# Patient Record
Sex: Male | Born: 1965 | Race: White | Hispanic: No | Marital: Single
Health system: Southern US, Community
[De-identification: ages and names within clinical notes are randomized; demographics above are authoritative.]

---

## 2021-07-01 ENCOUNTER — Inpatient Hospital Stay
Admission: EM | Admit: 2021-07-01 | Discharge: 2021-08-05 | Disposition: A | Discharge status: Other Acute Inpatient Hospital | Payer: Medicare Other | Source: Other Acute Inpatient Hospital | Attending: Internal Medicine | Admitting: Internal Medicine

## 2021-07-01 ENCOUNTER — Other Ambulatory Visit (HOSPITAL_COMMUNITY): Payer: Medicare Other

## 2021-07-01 DIAGNOSIS — Z431 Encounter for attention to gastrostomy: Secondary | ICD-10-CM

## 2021-07-01 DIAGNOSIS — F419 Anxiety disorder, unspecified: Secondary | ICD-10-CM

## 2021-07-01 DIAGNOSIS — Z452 Encounter for adjustment and management of vascular access device: Secondary | ICD-10-CM

## 2021-07-01 DIAGNOSIS — R509 Fever, unspecified: Secondary | ICD-10-CM

## 2021-07-01 DIAGNOSIS — G373 Acute transverse myelitis in demyelinating disease of central nervous system: Secondary | ICD-10-CM

## 2021-07-01 DIAGNOSIS — J9690 Respiratory failure, unspecified, unspecified whether with hypoxia or hypercapnia: Secondary | ICD-10-CM

## 2021-07-01 DIAGNOSIS — D72829 Elevated white blood cell count, unspecified: Secondary | ICD-10-CM

## 2021-07-01 DIAGNOSIS — J9621 Acute and chronic respiratory failure with hypoxia: Secondary | ICD-10-CM

## 2021-07-01 DIAGNOSIS — Z9189 Other specified personal risk factors, not elsewhere classified: Secondary | ICD-10-CM

## 2021-07-01 DIAGNOSIS — J939 Pneumothorax, unspecified: Secondary | ICD-10-CM

## 2021-07-01 DIAGNOSIS — J189 Pneumonia, unspecified organism: Secondary | ICD-10-CM

## 2021-07-01 DIAGNOSIS — I82A19 Acute embolism and thrombosis of unspecified axillary vein: Secondary | ICD-10-CM

## 2021-07-01 LAB — BLOOD GAS, ARTERIAL
Acid-Base Excess: 2.9 mmol/L — ABNORMAL HIGH (ref 0.0–2.0)
Acid-Base Excess: 3.6 mmol/L — ABNORMAL HIGH (ref 0.0–2.0)
Bicarbonate: 27.1 mmol/L (ref 20.0–28.0)
Bicarbonate: 28 mmol/L (ref 20.0–28.0)
Drawn by: 164
FIO2: 100
FIO2: 35
O2 Saturation: 99.6 %
O2 Saturation: 99.7 %
Patient temperature: 36.4
Patient temperature: 37
pCO2 arterial: 43.3 mmHg (ref 32.0–48.0)
pCO2 arterial: 43.8 mmHg (ref 32.0–48.0)
pH, Arterial: 7.413 (ref 7.350–7.450)
pH, Arterial: 7.418 (ref 7.350–7.450)
pO2, Arterial: 180 mmHg — ABNORMAL HIGH (ref 83.0–108.0)
pO2, Arterial: 275 mmHg — ABNORMAL HIGH (ref 83.0–108.0)

## 2021-07-02 LAB — CBC
HCT: 37.6 % — ABNORMAL LOW (ref 39.0–52.0)
Hemoglobin: 11.7 g/dL — ABNORMAL LOW (ref 13.0–17.0)
MCH: 27.3 pg (ref 26.0–34.0)
MCHC: 31.1 g/dL (ref 30.0–36.0)
MCV: 87.6 fL (ref 80.0–100.0)
Platelets: 182 10*3/uL (ref 150–400)
RBC: 4.29 MIL/uL (ref 4.22–5.81)
RDW: 16 % — ABNORMAL HIGH (ref 11.5–15.5)
WBC: 16.4 10*3/uL — ABNORMAL HIGH (ref 4.0–10.5)
nRBC: 0 % (ref 0.0–0.2)

## 2021-07-02 LAB — COMPREHENSIVE METABOLIC PANEL
ALT: 18 U/L (ref 0–44)
AST: 21 U/L (ref 15–41)
Albumin: 3.4 g/dL — ABNORMAL LOW (ref 3.5–5.0)
Alkaline Phosphatase: 135 U/L — ABNORMAL HIGH (ref 38–126)
Anion gap: 11 (ref 5–15)
BUN: 23 mg/dL — ABNORMAL HIGH (ref 6–20)
CO2: 25 mmol/L (ref 22–32)
Calcium: 9.7 mg/dL (ref 8.9–10.3)
Chloride: 100 mmol/L (ref 98–111)
Creatinine, Ser: 0.43 mg/dL — ABNORMAL LOW (ref 0.61–1.24)
GFR, Estimated: >60 mL/min (ref >60)
Glucose, Bld: 151 mg/dL — ABNORMAL HIGH (ref 70–99)
Potassium: 4.9 mmol/L (ref 3.5–5.1)
Sodium: 136 mmol/L (ref 135–145)
Total Bilirubin: 0.2 mg/dL — ABNORMAL LOW (ref 0.3–1.2)
Total Protein: 6.5 g/dL (ref 6.5–8.1)

## 2021-07-02 LAB — PROTIME-INR
INR: 1.5 — ABNORMAL HIGH (ref 0.8–1.2)
Prothrombin Time: 17.9 seconds — ABNORMAL HIGH (ref 11.4–15.2)

## 2021-07-03 DIAGNOSIS — F419 Anxiety disorder, unspecified: Secondary | ICD-10-CM

## 2021-07-03 DIAGNOSIS — J9621 Acute and chronic respiratory failure with hypoxia: Secondary | ICD-10-CM | POA: Diagnosis not present

## 2021-07-03 DIAGNOSIS — G373 Acute transverse myelitis in demyelinating disease of central nervous system: Secondary | ICD-10-CM | POA: Diagnosis not present

## 2021-07-03 DIAGNOSIS — I82A19 Acute embolism and thrombosis of unspecified axillary vein: Secondary | ICD-10-CM

## 2021-07-03 LAB — PROTIME-INR
INR: 1.6 — ABNORMAL HIGH (ref 0.8–1.2)
Prothrombin Time: 19.1 seconds — ABNORMAL HIGH (ref 11.4–15.2)

## 2021-07-03 LAB — TSH: TSH: 1.026 u[IU]/mL (ref 0.350–4.500)

## 2021-07-03 NOTE — Consult Note (Signed)
Pulmonary Critical Care Medicine [facility] [place]  PULMONARY SERVICE  Date of Service: 07/03/2021  PULMONARY CRITICAL CARE CONSULT   Dominic Benjamin  MWU:[id number]  DOB: 20-Feb-1966   DOA: 07/01/2021  Referring Physician: Carron Curie, MD  HPI: Dominic Benjamin is a 55 y.o. male seen for follow up of Acute on Chronic Respiratory Failure.  Patient has multiple medical problems including sepsis bradycardia diabetes mellitus presents to the hospital with a fall and had been confused.  The patient had apparently taken a fall few days prior to admission.  The patient had been seen at that time and was evaluated and was unremarkable however a few days later he continued to get more confused and was hypotensive not responding patient had profound bradycardia noted also.  MRI was done which showed a old lacunar infarct but nothing acute C-spine was done which showed signal alteration in the spinal cord from C2-T1 concerning for myelitis and edema.  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  Work-up included paraneoplastic which was unremarkable spinal tap was done which was unremarkable.  Transferred to our facility now for further management  Review of Systems:  ROS performed and is unremarkable other than noted above.  Past Medical History:  Diagnosis Date   AKI (acute kidney injury) (*) 05/25/2021   Anemia   Back pain   Diabetes mellitus (*)   GERD (gastroesophageal reflux disease)   Hyperlipidemia   Hypertension   Sepsis (*) 05/25/2021   Stroke (*) 05/2011   Past Surgical History:  Procedure Laterality Date   Anesth lower leg procedure Right 06/01/2016  right ankle fusion   Appendectomy   Back surgery   Colonoscopy N/A 12/22/2015  Procedure: COLONOSCOPY WITH ANESTHESIA ; Surgeon: Tonia Ghent, MD; Location: [facility] ENDO; Service: Gastroenterology; Laterality: N/A;   Heel spur surgery   Right hydrocelectomy   No  Known Allergies  Social History   Socioeconomic History   Marital status: Single  Spouse name: Not on file   Number of children: Not on file   Years of education: Not on file   Highest education level: Not on file  Occupational History   Not on file  Tobacco Use   Smoking status: Never Smoker   Smokeless tobacco: Current User  Types: Snuff  Substance and Sexual Activity   Alcohol use: No   Medications: Reviewed on Rounds  Physical Exam:  Vitals: Temperature is 97.6 pulse 66 respiratory rate is 27 blood pressure is 110/60 saturations 98%  Ventilator Settings on assist control FiO2 is 40% tidal volume 455 PEEP 5  General: Comfortable at this time Eyes: Grossly normal lids, irises & conjunctiva ENT: grossly tongue is normal Neck: no obvious mass Cardiovascular: S1-S2 normal no gallop or rub Respiratory: Scattered rhonchi expansion is equal Abdomen: Soft and nontender Skin: no rash seen on limited exam Musculoskeletal: not rigid Psychiatric:unable to assess Neurologic: no seizure no involuntary movements         Labs on Admission:  Basic Metabolic Panel: Recent Labs  Lab 07/02/21 0548  NA 136  K 4.9  CL 100  CO2 25  GLUCOSE 151*  BUN 23*  CREATININE 0.43*  CALCIUM 9.7    Recent Labs  Lab 07/01/21 1530 07/01/21 2330  PHART 7.413 7.418  PCO2ART 43.3 43.8  PO2ART 180* 275*  HCO3 27.1 28.0  O2SAT 99.6 99.7    Liver Function Tests: Recent Labs  Lab 07/02/21 0548  AST 21  ALT 18  ALKPHOS 135*  BILITOT 0.2*  PROT 6.5  ALBUMIN 3.4*   No results for input(s): LIPASE, AMYLASE in the last 168 hours. No results for input(s): AMMONIA in the last 168 hours.  CBC: Recent Labs  Lab 07/02/21 0548  WBC 16.4*  HGB 11.7*  HCT 37.6*  MCV 87.6  PLT 182    Cardiac Enzymes: No results for input(s): CKTOTAL, CKMB, CKMBINDEX, TROPONINI in the last 168 hours.  BNP (last 3 results) No results for input(s): BNP in the last 8760 hours.  ProBNP (last 3  results) No results for input(s): PROBNP in the last 8760 hours.   Radiological Exams on Admission: DG CHEST PORT 1 VIEW  Result Date: 07/01/2021 CLINICAL DATA:  At risk for change in condition.  On ventilator. EXAM: PORTABLE CHEST 1 VIEW COMPARISON:  None. FINDINGS: Tracheostomy tube tip projects over the thoracic inlet. The heart is normal in size. Normal mediastinal contours allowing for rotation. Mild streaky opacity in the left mid lung, favoring atelectasis. Minimal patchy opacity in the right mid lung zone. No pleural effusion or pneumothorax. No pulmonary edema. There is degenerative change in the spine. IMPRESSION: 1. Tracheostomy tube tip projects over the thoracic inlet. 2. Mild streaky opacity in the left mid lung, favoring atelectasis. Minimal patchy opacity in the right mid lung zone, atelectasis versus infection. Electronically Signed   By: Narda Rutherford M.D.   On: 07/01/2021 23:52   DG Abd Portable 1V  Result Date: 07/01/2021 CLINICAL DATA:  Assessed peg placement. EXAM: PORTABLE ABDOMEN - 1 VIEW COMPARISON:  None. FINDINGS: Image taken after Gastrografin was injected. Peg tube is within the stomach. Contrast pools within the antrum, entering the small intestine. No sign of extravasation. IMPRESSION: Peg tube in the stomach. Good appearance following contrast injection. No complicating feature. Electronically Signed   By: Paulina Fusi M.D.   On: 07/01/2021 17:10    Assessment/Plan Active Problems:   Acute on chronic respiratory failure with hypoxia (HCC)   Transverse myelitis (HCC)   Severe anxiety   DVT of axillary vein, acute (HCC)   Acute on chronic respiratory failure hypoxia at this time patient is on the ventilator and full support assist control mode respiratory therapy will check the RSB I and the mechanics to see if patient is feasible for weaning. Transverse myelitis resultant quadriplegia supportive care therapy as tolerated patient has already received course of  steroids Severe anxiety disorder patient had apparently been on oxycodone well defer to the primary care team for pain management Deep venous thrombosis last ultrasound was in August 3 showed some improvement patient was changed over to Lovenox and Coumadin which should be monitored.  I have personally seen and evaluated the patient, evaluated laboratory and imaging results, formulated the assessment and plan and placed orders. The Patient requires high complexity decision making with multiple systems involvement.  Case was discussed on Rounds with the Respiratory Therapy Director and the Respiratory staff Time Spent  Yevonne Pax, MD [facility] Pulmonary Critical Care Medicine Sleep Medicine

## 2021-07-04 DIAGNOSIS — G373 Acute transverse myelitis in demyelinating disease of central nervous system: Secondary | ICD-10-CM | POA: Diagnosis not present

## 2021-07-04 DIAGNOSIS — F419 Anxiety disorder, unspecified: Secondary | ICD-10-CM | POA: Diagnosis not present

## 2021-07-04 DIAGNOSIS — J9621 Acute and chronic respiratory failure with hypoxia: Secondary | ICD-10-CM | POA: Diagnosis not present

## 2021-07-04 DIAGNOSIS — I82A19 Acute embolism and thrombosis of unspecified axillary vein: Secondary | ICD-10-CM | POA: Diagnosis not present

## 2021-07-04 LAB — PROTIME-INR
INR: 2.8 — ABNORMAL HIGH (ref 0.8–1.2)
Prothrombin Time: 29.2 seconds — ABNORMAL HIGH (ref 11.4–15.2)

## 2021-07-04 NOTE — Progress Notes (Signed)
Pulmonary Critical Care Medicine [facility] [place]   PULMONARY CRITICAL CARE SERVICE  PROGRESS NOTE     Dominic Benjamin  [id number]:[id number]  DOB: 1966-05-16   DOA: 07/01/2021  Referring Physician: Carron Curie, MD  HPI: Dominic Benjamin is a 55 y.o. male being followed for ventilator/airway/oxygen weaning Acute on Chronic Respiratory Failure.  Patient is on the ventilator on full support was attempted on pressure support but did not tolerate it yesterday  Medications: Reviewed on Rounds  Physical Exam:  Vitals: Temperature is 96.4 pulse 60 respiratory 24 blood pressure is 119/59 saturations 97%  Ventilator Settings on assist control FiO2 is 40% tidal volume 450 PEEP 5  General: Comfortable at this time Neck: supple Cardiovascular: no malignant arrhythmias Respiratory: Scattered rhonchi very coarse breath sounds Skin: no rash seen on limited exam Musculoskeletal: No gross abnormality Psychiatric:unable to assess Neurologic:no involuntary movements         Lab Data:   Basic Metabolic Panel: Recent Labs  Lab 07/02/21 0548  NA 136  K 4.9  CL 100  CO2 25  GLUCOSE 151*  BUN 23*  CREATININE 0.43*  CALCIUM 9.7    ABG: Recent Labs  Lab 07/01/21 1530 07/01/21 2330  PHART 7.413 7.418  PCO2ART 43.3 43.8  PO2ART 180* 275*  HCO3 27.1 28.0  O2SAT 99.6 99.7    Liver Function Tests: Recent Labs  Lab 07/02/21 0548  AST 21  ALT 18  ALKPHOS 135*  BILITOT 0.2*  PROT 6.5  ALBUMIN 3.4*   No results for input(s): LIPASE, AMYLASE in the last 168 hours. No results for input(s): AMMONIA in the last 168 hours.  CBC: Recent Labs  Lab 07/02/21 0548  WBC 16.4*  HGB 11.7*  HCT 37.6*  MCV 87.6  PLT 182    Cardiac Enzymes: No results for input(s): CKTOTAL, CKMB, CKMBINDEX, TROPONINI in the last 168 hours.  BNP (last 3 results) No results for input(s): BNP in the last 8760 hours.  ProBNP (last 3 results) No results for input(s): PROBNP in the  last 8760 hours.  Radiological Exams: No results found.  Assessment/Plan Active Problems:   Acute on chronic respiratory failure with hypoxia (HCC)   Transverse myelitis (HCC)   Severe anxiety   DVT of axillary vein, acute (HCC)   Acute on chronic respiratory failure hypoxia patient is noted failed attempt at Pressure support wean plan is going to be to continue to have respiratory therapy assess the RSB I mechanics Transverse myelitis supportive care continue to monitor continue therapy as tolerated Severe anxiety no change supportive care DVT treated we will continue to follow along   I have personally seen and evaluated the patient, evaluated laboratory and imaging results, formulated the assessment and plan and placed orders. The Patient requires high complexity decision making with multiple systems involvement.  Rounds were done with the Respiratory Therapy Director and Staff therapists and discussed with nursing staff also.  Yevonne Pax, MD [facility] Pulmonary Critical Care Medicine Sleep Medicine

## 2021-07-05 DIAGNOSIS — G373 Acute transverse myelitis in demyelinating disease of central nervous system: Secondary | ICD-10-CM | POA: Diagnosis not present

## 2021-07-05 DIAGNOSIS — J9621 Acute and chronic respiratory failure with hypoxia: Secondary | ICD-10-CM | POA: Diagnosis not present

## 2021-07-05 DIAGNOSIS — I82A19 Acute embolism and thrombosis of unspecified axillary vein: Secondary | ICD-10-CM | POA: Diagnosis not present

## 2021-07-05 DIAGNOSIS — F419 Anxiety disorder, unspecified: Secondary | ICD-10-CM | POA: Diagnosis not present

## 2021-07-05 LAB — PROTIME-INR
INR: 3.4 — ABNORMAL HIGH (ref 0.8–1.2)
Prothrombin Time: 34.7 seconds — ABNORMAL HIGH (ref 11.4–15.2)

## 2021-07-05 NOTE — Progress Notes (Signed)
Pulmonary Critical Care Medicine [facility] [place]   PULMONARY CRITICAL CARE SERVICE  PROGRESS NOTE     Dominic Benjamin  [id number]:[id number]  DOB: 1965/12/11   DOA: 07/01/2021  Referring Physician: Carron Curie, MD  HPI: Dominic Benjamin is a 55 y.o. male being followed for ventilator/airway/oxygen weaning Acute on Chronic Respiratory Failure.  Patient is resting comfortably without distress remains on the ventilator and full support has been assist-control mode was attempted on pressure support again but did not tolerate  Medications: Reviewed on Rounds  Physical Exam:  Vitals: Temperature is 97.1 pulse 65 respiratory rate is 18 blood pressure is 132/80 saturations 97%  Ventilator Settings on assist control FiO2 is 50% tidal volume 430 PEEP of 5  General: Comfortable at this time Neck: supple Cardiovascular: no malignant arrhythmias Respiratory: No rhonchi no rales are noted at this time Skin: no rash seen on limited exam Musculoskeletal: No gross abnormality Psychiatric:unable to assess Neurologic:no involuntary movements         Lab Data:   Basic Metabolic Panel: Recent Labs  Lab 07/02/21 0548  NA 136  K 4.9  CL 100  CO2 25  GLUCOSE 151*  BUN 23*  CREATININE 0.43*  CALCIUM 9.7    ABG: Recent Labs  Lab 07/01/21 1530 07/01/21 2330  PHART 7.413 7.418  PCO2ART 43.3 43.8  PO2ART 180* 275*  HCO3 27.1 28.0  O2SAT 99.6 99.7    Liver Function Tests: Recent Labs  Lab 07/02/21 0548  AST 21  ALT 18  ALKPHOS 135*  BILITOT 0.2*  PROT 6.5  ALBUMIN 3.4*   No results for input(s): LIPASE, AMYLASE in the last 168 hours. No results for input(s): AMMONIA in the last 168 hours.  CBC: Recent Labs  Lab 07/02/21 0548  WBC 16.4*  HGB 11.7*  HCT 37.6*  MCV 87.6  PLT 182    Cardiac Enzymes: No results for input(s): CKTOTAL, CKMB, CKMBINDEX, TROPONINI in the last 168 hours.  BNP (last 3 results) No results for input(s): BNP in the last  8760 hours.  ProBNP (last 3 results) No results for input(s): PROBNP in the last 8760 hours.  Radiological Exams: No results found.  Assessment/Plan Active Problems:   Acute on chronic respiratory failure with hypoxia (HCC)   Transverse myelitis (HCC)   Severe anxiety   DVT of axillary vein, acute (HCC)   Acute on chronic respiratory failure hypoxia plan is to reassess the RSB I mechanics and try to wean once again. Severe anxiety under better control we will continue to follow along Transverse myelitis therapy as tolerated DVT treated slow improvement   I have personally seen and evaluated the patient, evaluated laboratory and imaging results, formulated the assessment and plan and placed orders. The Patient requires high complexity decision making with multiple systems involvement.  Rounds were done with the Respiratory Therapy Director and Staff therapists and discussed with nursing staff also.  Yevonne Pax, MD [facility] Pulmonary Critical Care Medicine Sleep Medicine

## 2021-07-06 DIAGNOSIS — I82A19 Acute embolism and thrombosis of unspecified axillary vein: Secondary | ICD-10-CM

## 2021-07-06 DIAGNOSIS — F419 Anxiety disorder, unspecified: Secondary | ICD-10-CM | POA: Diagnosis not present

## 2021-07-06 DIAGNOSIS — G373 Acute transverse myelitis in demyelinating disease of central nervous system: Secondary | ICD-10-CM

## 2021-07-06 DIAGNOSIS — J9621 Acute and chronic respiratory failure with hypoxia: Secondary | ICD-10-CM | POA: Diagnosis not present

## 2021-07-06 NOTE — Progress Notes (Signed)
Pulmonary Critical Care Medicine [facility] [place]   PULMONARY CRITICAL CARE SERVICE  PROGRESS NOTE     Dominic Benjamin  HBZ:[id number]  DOB: Sep 29, 1966   DOA: 07/01/2021  Referring Physician: Carron Curie, MD  HPI: Dominic Benjamin is a 55 y.o. male being followed for ventilator/airway/oxygen weaning Acute on Chronic Respiratory Failure.  Patient is on pressure support wean the goal is for 8 hours today  Medications: Reviewed on Rounds  Physical Exam:  Vitals: Temperature is 98.0 pulse 70 respiratory 22 blood pressure is 131/68 saturations 96%  Ventilator Settings on pressure support FiO2 is 40% pressure 12/5  General: Comfortable at this time Neck: supple Cardiovascular: no malignant arrhythmias Respiratory: No rhonchi very coarse breath sounds Skin: no rash seen on limited exam Musculoskeletal: No gross abnormality Psychiatric:unable to assess Neurologic:no involuntary movements         Lab Data:   Basic Metabolic Panel: Recent Labs  Lab 07/02/21 0548  NA 136  K 4.9  CL 100  CO2 25  GLUCOSE 151*  BUN 23*  CREATININE 0.43*  CALCIUM 9.7    ABG: Recent Labs  Lab 07/01/21 1530 07/01/21 2330  PHART 7.413 7.418  PCO2ART 43.3 43.8  PO2ART 180* 275*  HCO3 27.1 28.0  O2SAT 99.6 99.7    Liver Function Tests: Recent Labs  Lab 07/02/21 0548  AST 21  ALT 18  ALKPHOS 135*  BILITOT 0.2*  PROT 6.5  ALBUMIN 3.4*   No results for input(s): LIPASE, AMYLASE in the last 168 hours. No results for input(s): AMMONIA in the last 168 hours.  CBC: Recent Labs  Lab 07/02/21 0548  WBC 16.4*  HGB 11.7*  HCT 37.6*  MCV 87.6  PLT 182    Cardiac Enzymes: No results for input(s): CKTOTAL, CKMB, CKMBINDEX, TROPONINI in the last 168 hours.  BNP (last 3 results) No results for input(s): BNP in the last 8760 hours.  ProBNP (last 3 results) No results for input(s): PROBNP in the last 8760 hours.  Radiological Exams: No results  found.  Assessment/Plan Active Problems:   Acute on chronic respiratory failure with hypoxia (HCC)   Transverse myelitis (HCC)   Severe anxiety   DVT of axillary vein, acute (HCC)   Acute on chronic respiratory failure hypoxia currently on pressure support goal of 8 hours for weaning Transverse myelitis treated we will continue to follow along prognosis remains guarded therapy as tolerated Severe anxiety pain needs good control doing well so far DVT treated has been on anticoagulation   I have personally seen and evaluated the patient, evaluated laboratory and imaging results, formulated the assessment and plan and placed orders. The Patient requires high complexity decision making with multiple systems involvement.  Rounds were done with the Respiratory Therapy Director and Staff therapists and discussed with nursing staff also.  Yevonne Pax, MD [facility] Pulmonary Critical Care Medicine Sleep Medicine

## 2021-07-07 DIAGNOSIS — G373 Acute transverse myelitis in demyelinating disease of central nervous system: Secondary | ICD-10-CM | POA: Diagnosis not present

## 2021-07-07 DIAGNOSIS — J9621 Acute and chronic respiratory failure with hypoxia: Secondary | ICD-10-CM | POA: Diagnosis not present

## 2021-07-07 DIAGNOSIS — F419 Anxiety disorder, unspecified: Secondary | ICD-10-CM | POA: Diagnosis not present

## 2021-07-07 DIAGNOSIS — I82A19 Acute embolism and thrombosis of unspecified axillary vein: Secondary | ICD-10-CM | POA: Diagnosis not present

## 2021-07-07 LAB — PROTIME-INR
INR: 3.2 — ABNORMAL HIGH (ref 0.8–1.2)
Prothrombin Time: 32.4 seconds — ABNORMAL HIGH (ref 11.4–15.2)

## 2021-07-07 NOTE — Progress Notes (Signed)
Pulmonary Critical Care Medicine [facility] [place]   PULMONARY CRITICAL CARE SERVICE  PROGRESS NOTE     Dominic Benjamin  KDX:[id number]  DOB: 10-Feb-1966   DOA: 07/01/2021  Referring Physician: Carron Curie, MD  HPI: Dominic Benjamin is a 55 y.o. male being followed for ventilator/airway/oxygen weaning Acute on Chronic Respiratory Failure.  Patient is comfortable right now without distress there was a low-grade fever noted patient was supposed to wean on pressure support looks good so far  Medications: Reviewed on Rounds  Physical Exam:  Vitals: Temperature 100.9 pulse 60 respiratory rate is 18 blood pressure is 113/58 saturations 98%  Ventilator Settings on assist control tidal volume of 413  General: Comfortable at this time Neck: supple Cardiovascular: no malignant arrhythmias Respiratory: No rhonchi no rales noted at this time Skin: no rash seen on limited exam Musculoskeletal: No gross abnormality Psychiatric:unable to assess Neurologic:no involuntary movements         Lab Data:   Basic Metabolic Panel: Recent Labs  Lab 07/02/21 0548  NA 136  K 4.9  CL 100  CO2 25  GLUCOSE 151*  BUN 23*  CREATININE 0.43*  CALCIUM 9.7    ABG: Recent Labs  Lab 07/01/21 1530 07/01/21 2330  PHART 7.413 7.418  PCO2ART 43.3 43.8  PO2ART 180* 275*  HCO3 27.1 28.0  O2SAT 99.6 99.7    Liver Function Tests: Recent Labs  Lab 07/02/21 0548  AST 21  ALT 18  ALKPHOS 135*  BILITOT 0.2*  PROT 6.5  ALBUMIN 3.4*   No results for input(s): LIPASE, AMYLASE in the last 168 hours. No results for input(s): AMMONIA in the last 168 hours.  CBC: Recent Labs  Lab 07/02/21 0548  WBC 16.4*  HGB 11.7*  HCT 37.6*  MCV 87.6  PLT 182    Cardiac Enzymes: No results for input(s): CKTOTAL, CKMB, CKMBINDEX, TROPONINI in the last 168 hours.  BNP (last 3 results) No results for input(s): BNP in the last 8760 hours.  ProBNP (last 3 results) No results for  input(s): PROBNP in the last 8760 hours.  Radiological Exams: No results found.  Assessment/Plan Active Problems:   Acute on chronic respiratory failure with hypoxia (HCC)   Transverse myelitis (HCC)   Severe anxiety   DVT of axillary vein, acute (HCC)   Acute on chronic respiratory failure with hypoxia plan is going to be to continue with advancing the wean titrate oxygen as tolerated continue secretion management pulmonary toilet. Transverse myelitis no change continue with supportive care patient's prognosis is guarded for being able to wean completely Severe anxiety no change supportive care medical management DVT treated we will continue to follow along closely   I have personally seen and evaluated the patient, evaluated laboratory and imaging results, formulated the assessment and plan and placed orders. The Patient requires high complexity decision making with multiple systems involvement.  Rounds were done with the Respiratory Therapy Director and Staff therapists and discussed with nursing staff also.  Dominic Pax, MD [facility] Pulmonary Critical Care Medicine Sleep Medicine

## 2021-07-08 DIAGNOSIS — J9621 Acute and chronic respiratory failure with hypoxia: Secondary | ICD-10-CM | POA: Diagnosis not present

## 2021-07-08 DIAGNOSIS — F419 Anxiety disorder, unspecified: Secondary | ICD-10-CM | POA: Diagnosis not present

## 2021-07-08 DIAGNOSIS — G373 Acute transverse myelitis in demyelinating disease of central nervous system: Secondary | ICD-10-CM | POA: Diagnosis not present

## 2021-07-08 DIAGNOSIS — I82A19 Acute embolism and thrombosis of unspecified axillary vein: Secondary | ICD-10-CM | POA: Diagnosis not present

## 2021-07-08 LAB — CBC
HCT: 36.4 % — ABNORMAL LOW (ref 39.0–52.0)
Hemoglobin: 10.8 g/dL — ABNORMAL LOW (ref 13.0–17.0)
MCH: 26.6 pg (ref 26.0–34.0)
MCHC: 29.7 g/dL — ABNORMAL LOW (ref 30.0–36.0)
MCV: 89.7 fL (ref 80.0–100.0)
Platelets: 195 10*3/uL (ref 150–400)
RBC: 4.06 MIL/uL — ABNORMAL LOW (ref 4.22–5.81)
RDW: 16.2 % — ABNORMAL HIGH (ref 11.5–15.5)
WBC: 10.3 10*3/uL (ref 4.0–10.5)
nRBC: 0 % (ref 0.0–0.2)

## 2021-07-08 LAB — BASIC METABOLIC PANEL
Anion gap: 9 (ref 5–15)
BUN: 56 mg/dL — ABNORMAL HIGH (ref 6–20)
CO2: 28 mmol/L (ref 22–32)
Calcium: 9.4 mg/dL (ref 8.9–10.3)
Chloride: 108 mmol/L (ref 98–111)
Creatinine, Ser: 0.69 mg/dL (ref 0.61–1.24)
GFR, Estimated: >60 mL/min (ref >60)
Glucose, Bld: 257 mg/dL — ABNORMAL HIGH (ref 70–99)
Potassium: 5 mmol/L (ref 3.5–5.1)
Sodium: 145 mmol/L (ref 135–145)

## 2021-07-08 LAB — CULTURE, RESPIRATORY W GRAM STAIN: Culture: NORMAL

## 2021-07-08 LAB — MAGNESIUM: Magnesium: 2 mg/dL (ref 1.7–2.4)

## 2021-07-08 LAB — PROTIME-INR
INR: 2.3 — ABNORMAL HIGH (ref 0.8–1.2)
Prothrombin Time: 25 seconds — ABNORMAL HIGH (ref 11.4–15.2)

## 2021-07-08 NOTE — Progress Notes (Signed)
Pulmonary Critical Care Medicine [facility] [place]   PULMONARY CRITICAL CARE SERVICE  PROGRESS NOTE     Dominic Benjamin  [id number]:[id number]  DOB: October 01, 1966   DOA: 07/01/2021  Referring Physician: Luna Kitchens, MD  HPI: Dominic Benjamin is a 55 y.o. male being followed for ventilator/airway/oxygen weaning Acute on Chronic Respiratory Failure.  Patient is comfortable right now however had a fever temperature was up to 101.9 earlier this morning  Medications: Reviewed on Rounds  Physical Exam:  Vitals: Temperature 101.9 pulse 92 respiratory 22 blood pressure is 116/74 saturations 98%  Ventilator Settings on pressure support FiO2 is 35% pressure 12/5  General: Comfortable at this time Neck: supple Cardiovascular: no malignant arrhythmias Respiratory: No rhonchi no rales are noted at this time. Skin: no rash seen on limited exam Musculoskeletal: No gross abnormality Psychiatric:unable to assess Neurologic:no involuntary movements         Lab Data:   Basic Metabolic Panel: Recent Labs  Lab 07/02/21 0548 07/08/21 0519  NA 136 145  K 4.9 5.0  CL 100 108  CO2 25 28  GLUCOSE 151* 257*  BUN 23* 56*  CREATININE 0.43* 0.69  CALCIUM 9.7 9.4  MG  --  2.0    ABG: Recent Labs  Lab 07/01/21 1530 07/01/21 2330  PHART 7.413 7.418  PCO2ART 43.3 43.8  PO2ART 180* 275*  HCO3 27.1 28.0  O2SAT 99.6 99.7    Liver Function Tests: Recent Labs  Lab 07/02/21 0548  AST 21  ALT 18  ALKPHOS 135*  BILITOT 0.2*  PROT 6.5  ALBUMIN 3.4*   No results for input(s): LIPASE, AMYLASE in the last 168 hours. No results for input(s): AMMONIA in the last 168 hours.  CBC: Recent Labs  Lab 07/02/21 0548 07/08/21 0519  WBC 16.4* 10.3  HGB 11.7* 10.8*  HCT 37.6* 36.4*  MCV 87.6 89.7  PLT 182 195    Cardiac Enzymes: No results for input(s): CKTOTAL, CKMB, CKMBINDEX, TROPONINI in the last 168 hours.  BNP (last 3 results) No results for input(s): BNP in the  last 8760 hours.  ProBNP (last 3 results) No results for input(s): PROBNP in the last 8760 hours.  Radiological Exams: No results found.  Assessment/Plan Active Problems:   Acute on chronic respiratory failure with hypoxia (HCC)   Transverse myelitis (HCC)   Severe anxiety   DVT of axillary vein, acute (HCC)   Acute on chronic respiratory failure hypoxia plan is going to be to continue with try to wean on pressure support the goal is for 16 hours seems to be tolerating it well on 12/5.  Fever is still present will need to be worked up Transverse myelitis unchanged supportive care Severe anxiety no change we will continue to follow along closely. DVT treated with anticoagulation   I have personally seen and evaluated the patient, evaluated laboratory and imaging results, formulated the assessment and plan and placed orders. The Patient requires high complexity decision making with multiple systems involvement.  Rounds were done with the Respiratory Therapy Director and Staff therapists and discussed with nursing staff also.  Yevonne Pax, MD [facility] Pulmonary Critical Care Medicine Sleep Medicine

## 2021-07-09 DIAGNOSIS — J9621 Acute and chronic respiratory failure with hypoxia: Secondary | ICD-10-CM | POA: Diagnosis not present

## 2021-07-09 DIAGNOSIS — G373 Acute transverse myelitis in demyelinating disease of central nervous system: Secondary | ICD-10-CM | POA: Diagnosis not present

## 2021-07-09 DIAGNOSIS — F419 Anxiety disorder, unspecified: Secondary | ICD-10-CM | POA: Diagnosis not present

## 2021-07-09 DIAGNOSIS — I82A19 Acute embolism and thrombosis of unspecified axillary vein: Secondary | ICD-10-CM | POA: Diagnosis not present

## 2021-07-09 LAB — PROTIME-INR
INR: 2 — ABNORMAL HIGH (ref 0.8–1.2)
Prothrombin Time: 22.3 seconds — ABNORMAL HIGH (ref 11.4–15.2)

## 2021-07-09 NOTE — Progress Notes (Signed)
Pulmonary Critical Care Medicine [facility] [place]   PULMONARY CRITICAL CARE SERVICE  PROGRESS NOTE     Dominic Benjamin  HAL:[id number]  DOB: January 29, 1966   DOA: 07/01/2021  Referring Physician: Luna Kitchens, MD  HPI: Dominic Benjamin is a 55 y.o. male being followed for ventilator/airway/oxygen weaning Acute on Chronic Respiratory Failure.  Patient is comfortable right now without distress at this time has been on weaning protocol supposed to do 2 hours of T collar  Medications: Reviewed on Rounds  Physical Exam:  Vitals: Temperature is 98.1 pulse 80 respiratory rate 30 blood pressure is 133/91  Ventilator Settings pressure support 12/5 for 2 hours T collar    General: Comfortable at this time Neck: supple Cardiovascular: no malignant arrhythmias Respiratory: No rhonchi coarse breath sounds Skin: no rash seen on limited exam Musculoskeletal: No gross abnormality Psychiatric:unable to assess Neurologic:no involuntary movements         Lab Data:   Basic Metabolic Panel: Recent Labs  Lab 07/08/21 0519  NA 145  K 5.0  CL 108  CO2 28  GLUCOSE 257*  BUN 56*  CREATININE 0.69  CALCIUM 9.4  MG 2.0    ABG: No results for input(s): PHART, PCO2ART, PO2ART, HCO3, O2SAT in the last 168 hours.  Liver Function Tests: No results for input(s): AST, ALT, ALKPHOS, BILITOT, PROT, ALBUMIN in the last 168 hours. No results for input(s): LIPASE, AMYLASE in the last 168 hours. No results for input(s): AMMONIA in the last 168 hours.  CBC: Recent Labs  Lab 07/08/21 0519  WBC 10.3  HGB 10.8*  HCT 36.4*  MCV 89.7  PLT 195    Cardiac Enzymes: No results for input(s): CKTOTAL, CKMB, CKMBINDEX, TROPONINI in the last 168 hours.  BNP (last 3 results) No results for input(s): BNP in the last 8760 hours.  ProBNP (last 3 results) No results for input(s): PROBNP in the last 8760 hours.  Radiological Exams: No results found.  Assessment/Plan Active  Problems:   Acute on chronic respiratory failure with hypoxia (HCC)   Transverse myelitis (HCC)   Severe anxiety   DVT of axillary vein, acute (HCC)   Acute on chronic respiratory failure hypoxia patient will continue with the weaning protocol T collar for 2 hours.  We will continue to advance Transverse myelitis supportive care therapy as tolerated we will continue to monitor Severe anxiety under better control DVT treated with anticoagulation   I have personally seen and evaluated the patient, evaluated laboratory and imaging results, formulated the assessment and plan and placed orders. The Patient requires high complexity decision making with multiple systems involvement.  Rounds were done with the Respiratory Therapy Director and Staff therapists and discussed with nursing staff also.  Yevonne Pax, MD [facility] Pulmonary Critical Care Medicine Sleep Medicine

## 2021-07-10 LAB — BASIC METABOLIC PANEL
Anion gap: 9 (ref 5–15)
BUN: 59 mg/dL — ABNORMAL HIGH (ref 6–20)
CO2: 26 mmol/L (ref 22–32)
Calcium: 9.5 mg/dL (ref 8.9–10.3)
Chloride: 107 mmol/L (ref 98–111)
Creatinine, Ser: 0.48 mg/dL — ABNORMAL LOW (ref 0.61–1.24)
GFR, Estimated: >60 mL/min (ref >60)
Glucose, Bld: 274 mg/dL — ABNORMAL HIGH (ref 70–99)
Potassium: 5.4 mmol/L — ABNORMAL HIGH (ref 3.5–5.1)
Sodium: 142 mmol/L (ref 135–145)

## 2021-07-10 LAB — CBC
HCT: 35.8 % — ABNORMAL LOW (ref 39.0–52.0)
Hemoglobin: 10.7 g/dL — ABNORMAL LOW (ref 13.0–17.0)
MCH: 26.9 pg (ref 26.0–34.0)
MCHC: 29.9 g/dL — ABNORMAL LOW (ref 30.0–36.0)
MCV: 89.9 fL (ref 80.0–100.0)
Platelets: 137 10*3/uL — ABNORMAL LOW (ref 150–400)
RBC: 3.98 MIL/uL — ABNORMAL LOW (ref 4.22–5.81)
RDW: 16.8 % — ABNORMAL HIGH (ref 11.5–15.5)
WBC: 11.1 10*3/uL — ABNORMAL HIGH (ref 4.0–10.5)
nRBC: 0 % (ref 0.0–0.2)

## 2021-07-10 LAB — PROTIME-INR
INR: 1.6 — ABNORMAL HIGH (ref 0.8–1.2)
Prothrombin Time: 18.9 seconds — ABNORMAL HIGH (ref 11.4–15.2)

## 2021-07-10 LAB — POTASSIUM: Potassium: 5 mmol/L (ref 3.5–5.1)

## 2021-07-10 LAB — HEMOGLOBIN A1C
Hgb A1c MFr Bld: 6.2 % — ABNORMAL HIGH (ref 4.8–5.6)
Mean Plasma Glucose: 131.24 mg/dL

## 2021-07-11 ENCOUNTER — Other Ambulatory Visit (HOSPITAL_COMMUNITY): Payer: Medicare Other

## 2021-07-11 DIAGNOSIS — J9621 Acute and chronic respiratory failure with hypoxia: Secondary | ICD-10-CM | POA: Diagnosis not present

## 2021-07-11 DIAGNOSIS — I82A19 Acute embolism and thrombosis of unspecified axillary vein: Secondary | ICD-10-CM | POA: Diagnosis not present

## 2021-07-11 DIAGNOSIS — F419 Anxiety disorder, unspecified: Secondary | ICD-10-CM | POA: Diagnosis not present

## 2021-07-11 DIAGNOSIS — G373 Acute transverse myelitis in demyelinating disease of central nervous system: Secondary | ICD-10-CM | POA: Diagnosis not present

## 2021-07-11 LAB — PROTIME-INR
INR: 1.2 (ref 0.8–1.2)
Prothrombin Time: 15.6 seconds — ABNORMAL HIGH (ref 11.4–15.2)

## 2021-07-11 NOTE — Progress Notes (Signed)
Pulmonary Critical Care Medicine [facility] [place]   PULMONARY CRITICAL CARE SERVICE  PROGRESS NOTE     Kejon Feild  WFU:[id number]  DOB: 09-10-1966   DOA: 07/01/2021  Referring Physician: Luna Kitchens, MD  HPI: Avyon Herendeen is a 55 y.o. male being followed for ventilator/airway/oxygen weaning Acute on Chronic Respiratory Failure.  Patient is comfortable right now without distress has been afebrile without major changes noted  Medications: Reviewed on Rounds  Physical Exam:  Vitals: Temperature is 98.3 pulse 82 respiratory 28 blood pressure is 153/83 saturations 98%  Ventilator Settings on pressure support pressure 12/5  General: Comfortable at this time Neck: supple Cardiovascular: no malignant arrhythmias Respiratory: No rhonchi no rales are noted Skin: no rash seen on limited exam Musculoskeletal: No gross abnormality Psychiatric:unable to assess Neurologic:no involuntary movements         Lab Data:   Basic Metabolic Panel: Recent Labs  Lab 07/08/21 0519 07/10/21 0704 07/10/21 1035  NA 145 142  --   K 5.0 5.4* 5.0  CL 108 107  --   CO2 28 26  --   GLUCOSE 257* 274*  --   BUN 56* 59*  --   CREATININE 0.69 0.48*  --   CALCIUM 9.4 9.5  --   MG 2.0  --   --     ABG: No results for input(s): PHART, PCO2ART, PO2ART, HCO3, O2SAT in the last 168 hours.  Liver Function Tests: No results for input(s): AST, ALT, ALKPHOS, BILITOT, PROT, ALBUMIN in the last 168 hours. No results for input(s): LIPASE, AMYLASE in the last 168 hours. No results for input(s): AMMONIA in the last 168 hours.  CBC: Recent Labs  Lab 07/08/21 0519 07/10/21 0704  WBC 10.3 11.1*  HGB 10.8* 10.7*  HCT 36.4* 35.8*  MCV 89.7 89.9  PLT 195 137*    Cardiac Enzymes: No results for input(s): CKTOTAL, CKMB, CKMBINDEX, TROPONINI in the last 168 hours.  BNP (last 3 results) No results for input(s): BNP in the last 8760 hours.  ProBNP (last 3 results) No  results for input(s): PROBNP in the last 8760 hours.  Radiological Exams: No results found.  Assessment/Plan Active Problems:   Acute on chronic respiratory failure with hypoxia (HCC)   Transverse myelitis (HCC)   Severe anxiety   DVT of axillary vein, acute (HCC)   Acute on chronic respiratory failure with hypoxia continue to wean on pressure support today's goal is 8 hours.  Continue secretion management pulmonary toilet Transverse myelitis overall no change Severe anxiety we will continue to monitor closely. DVT treated we will continue to follow along closely.   I have personally seen and evaluated the patient, evaluated laboratory and imaging results, formulated the assessment and plan and placed orders. The Patient requires high complexity decision making with multiple systems involvement.  Rounds were done with the Respiratory Therapy Director and Staff therapists and discussed with nursing staff also.  Yevonne Pax, MD [facility] Pulmonary Critical Care Medicine Sleep Medicine

## 2021-07-12 DIAGNOSIS — J9621 Acute and chronic respiratory failure with hypoxia: Secondary | ICD-10-CM | POA: Diagnosis not present

## 2021-07-12 DIAGNOSIS — F419 Anxiety disorder, unspecified: Secondary | ICD-10-CM | POA: Diagnosis not present

## 2021-07-12 DIAGNOSIS — I82A19 Acute embolism and thrombosis of unspecified axillary vein: Secondary | ICD-10-CM | POA: Diagnosis not present

## 2021-07-12 DIAGNOSIS — G373 Acute transverse myelitis in demyelinating disease of central nervous system: Secondary | ICD-10-CM | POA: Diagnosis not present

## 2021-07-12 LAB — BASIC METABOLIC PANEL
Anion gap: 7 (ref 5–15)
BUN: 50 mg/dL — ABNORMAL HIGH (ref 6–20)
CO2: 27 mmol/L (ref 22–32)
Calcium: 9.2 mg/dL (ref 8.9–10.3)
Chloride: 103 mmol/L (ref 98–111)
Creatinine, Ser: 0.45 mg/dL — ABNORMAL LOW (ref 0.61–1.24)
GFR, Estimated: >60 mL/min (ref >60)
Glucose, Bld: 276 mg/dL — ABNORMAL HIGH (ref 70–99)
Potassium: 5.6 mmol/L — ABNORMAL HIGH (ref 3.5–5.1)
Sodium: 137 mmol/L (ref 135–145)

## 2021-07-12 LAB — PROTIME-INR
INR: 1.2 (ref 0.8–1.2)
Prothrombin Time: 14.7 seconds (ref 11.4–15.2)

## 2021-07-12 LAB — CBC
HCT: 39.8 % (ref 39.0–52.0)
Hemoglobin: 12.3 g/dL — ABNORMAL LOW (ref 13.0–17.0)
MCH: 27.4 pg (ref 26.0–34.0)
MCHC: 30.9 g/dL (ref 30.0–36.0)
MCV: 88.6 fL (ref 80.0–100.0)
Platelets: 131 10*3/uL — ABNORMAL LOW (ref 150–400)
RBC: 4.49 MIL/uL (ref 4.22–5.81)
RDW: 17.5 % — ABNORMAL HIGH (ref 11.5–15.5)
WBC: 13.1 10*3/uL — ABNORMAL HIGH (ref 4.0–10.5)
nRBC: 0 % (ref 0.0–0.2)

## 2021-07-12 NOTE — Progress Notes (Signed)
Pulmonary Critical Care Medicine [facility] [place]   PULMONARY CRITICAL CARE SERVICE  PROGRESS NOTE     Zaidyn Claire  MVH:[id number]  DOB: 1966-11-08   DOA: 07/01/2021  Referring Physician: Luna Kitchens, MD  HPI: Lytle Malburg is a 55 y.o. male being followed for ventilator/airway/oxygen weaning Acute on Chronic Respiratory Failure.  Patient is afebrile resting comfortably right now without distress has been on T collar goal of 20 hours  Medications: Reviewed on Rounds  Physical Exam:  Vitals: Temperature is 96.9 pulse 65 respiratory 21 blood pressure is 102/63 saturations 100%  Ventilator Settings on T collar FiO2 28%  General: Comfortable at this time Neck: supple Cardiovascular: no malignant arrhythmias Respiratory: No rhonchi very coarse breath sounds Skin: no rash seen on limited exam Musculoskeletal: No gross abnormality Psychiatric:unable to assess Neurologic:no involuntary movements         Lab Data:   Basic Metabolic Panel: Recent Labs  Lab 07/08/21 0519 07/10/21 0704 07/10/21 1035 07/12/21 0514  NA 145 142  --  137  K 5.0 5.4* 5.0 5.6*  CL 108 107  --  103  CO2 28 26  --  27  GLUCOSE 257* 274*  --  276*  BUN 56* 59*  --  50*  CREATININE 0.69 0.48*  --  0.45*  CALCIUM 9.4 9.5  --  9.2  MG 2.0  --   --   --     ABG: No results for input(s): PHART, PCO2ART, PO2ART, HCO3, O2SAT in the last 168 hours.  Liver Function Tests: No results for input(s): AST, ALT, ALKPHOS, BILITOT, PROT, ALBUMIN in the last 168 hours. No results for input(s): LIPASE, AMYLASE in the last 168 hours. No results for input(s): AMMONIA in the last 168 hours.  CBC: Recent Labs  Lab 07/08/21 0519 07/10/21 0704 07/12/21 0514  WBC 10.3 11.1* 13.1*  HGB 10.8* 10.7* 12.3*  HCT 36.4* 35.8* 39.8  MCV 89.7 89.9 88.6  PLT 195 137* 131*    Cardiac Enzymes: No results for input(s): CKTOTAL, CKMB, CKMBINDEX, TROPONINI in the last 168 hours.  BNP  (last 3 results) No results for input(s): BNP in the last 8760 hours.  ProBNP (last 3 results) No results for input(s): PROBNP in the last 8760 hours.  Radiological Exams: DG Chest Port 1 View  Result Date: 07/11/2021 CLINICAL DATA:  Elevated white blood cell count EXAM: PORTABLE CHEST 1 VIEW COMPARISON:  07/01/2021 FINDINGS: Frontal view of the chest was obtained. The patient is markedly rotated to the left at the time of imaging, severely limited the evaluation. Please note that the image is mislabeled by the technologist, as previous exams have demonstrated an elevated left hemidiaphragm. The cardiac silhouette is grossly unremarkable. No airspace disease, effusion, or pneumothorax. No acute bony abnormality. Tracheostomy tube unchanged. IMPRESSION: 1. Severely limited evaluation due to patient positioning. 2. No gross airspace disease. Electronically Signed   By: Sharlet Salina M.D.   On: 07/11/2021 17:14    Assessment/Plan Active Problems:   Acute on chronic respiratory failure with hypoxia (HCC)   Transverse myelitis (HCC)   Severe anxiety   DVT of axillary vein, acute (HCC)   Acute on chronic respiratory failure with hypoxia plan is to wean goal of 20 hours on T collar Transverse myelitis no change continue with supportive care Severe anxiety patient is at baseline DVT treated we will continue to follow on anticoagulation    I have personally seen and evaluated the patient, evaluated laboratory and imaging  results, formulated the assessment and plan and placed orders. The Patient requires high complexity decision making with multiple systems involvement.  Rounds were done with the Respiratory Therapy Director and Staff therapists and discussed with nursing staff also.  Allyne Gee, MD [facility] Pulmonary Critical Care Medicine Sleep Medicine

## 2021-07-13 DIAGNOSIS — F419 Anxiety disorder, unspecified: Secondary | ICD-10-CM | POA: Diagnosis not present

## 2021-07-13 DIAGNOSIS — I82A19 Acute embolism and thrombosis of unspecified axillary vein: Secondary | ICD-10-CM | POA: Diagnosis not present

## 2021-07-13 DIAGNOSIS — G373 Acute transverse myelitis in demyelinating disease of central nervous system: Secondary | ICD-10-CM | POA: Diagnosis not present

## 2021-07-13 DIAGNOSIS — J9621 Acute and chronic respiratory failure with hypoxia: Secondary | ICD-10-CM | POA: Diagnosis not present

## 2021-07-13 LAB — PROTIME-INR
INR: 1.1 (ref 0.8–1.2)
Prothrombin Time: 13.9 seconds (ref 11.4–15.2)

## 2021-07-13 LAB — POTASSIUM: Potassium: 4 mmol/L (ref 3.5–5.1)

## 2021-07-13 NOTE — Progress Notes (Signed)
Pulmonary Critical Care Medicine [facility] [place]   PULMONARY CRITICAL CARE SERVICE  PROGRESS NOTE     Dominic Benjamin  HYI:[id number]  DOB: [birth year]   DOA: 07/01/2021  Referring Physician: Luna Kitchens, MD  HPI: Dominic Benjamin is a 55 y.o. male being followed for ventilator/airway/oxygen weaning Acute on Chronic Respiratory Failure.  Patient is resting comfortably right now without distress no fevers are noted patient has been on T collar the goal today is for 24 hours  Medications: Reviewed on Rounds  Physical Exam:  Vitals: Temperature is 97.0 pulse 51 respiratory is 20 blood pressure 107/60 saturations 95%  Ventilator Settings on T collar FiO2 28%  General: Comfortable at this time Neck: supple Cardiovascular: no malignant arrhythmias Respiratory: No rhonchi no rales are noted at this time Skin: no rash seen on limited exam Musculoskeletal: No gross abnormality Psychiatric:unable to assess Neurologic:no involuntary movements         Lab Data:   Basic Metabolic Panel: Recent Labs  Lab 07/08/21 0519 07/10/21 0704 07/10/21 1035 07/12/21 0514 07/13/21 0525  NA 145 142  --  137  --   K 5.0 5.4* 5.0 5.6* 4.0  CL 108 107  --  103  --   CO2 28 26  --  27  --   GLUCOSE 257* 274*  --  276*  --   BUN 56* 59*  --  50*  --   CREATININE 0.69 0.48*  --  0.45*  --   CALCIUM 9.4 9.5  --  9.2  --   MG 2.0  --   --   --   --     ABG: No results for input(s): PHART, PCO2ART, PO2ART, HCO3, O2SAT in the last 168 hours.  Liver Function Tests: No results for input(s): AST, ALT, ALKPHOS, BILITOT, PROT, ALBUMIN in the last 168 hours. No results for input(s): LIPASE, AMYLASE in the last 168 hours. No results for input(s): AMMONIA in the last 168 hours.  CBC: Recent Labs  Lab 07/08/21 0519 07/10/21 0704 07/12/21 0514  WBC 10.3 11.1* 13.1*  HGB 10.8* 10.7* 12.3*  HCT 36.4* 35.8* 39.8  MCV 89.7 89.9 88.6  PLT 195 137* 131*    Cardiac  Enzymes: No results for input(s): CKTOTAL, CKMB, CKMBINDEX, TROPONINI in the last 168 hours.  BNP (last 3 results) No results for input(s): BNP in the last 8760 hours.  ProBNP (last 3 results) No results for input(s): PROBNP in the last 8760 hours.  Radiological Exams: DG Chest Port 1 View  Result Date: 07/11/2021 CLINICAL DATA:  Elevated white blood cell count EXAM: PORTABLE CHEST 1 VIEW COMPARISON:  07/01/2021 FINDINGS: Frontal view of the chest was obtained. The patient is markedly rotated to the left at the time of imaging, severely limited the evaluation. Please note that the image is mislabeled by the technologist, as previous exams have demonstrated an elevated left hemidiaphragm. The cardiac silhouette is grossly unremarkable. No airspace disease, effusion, or pneumothorax. No acute bony abnormality. Tracheostomy tube unchanged. IMPRESSION: 1. Severely limited evaluation due to patient positioning. 2. No gross airspace disease. Electronically Signed   By: Sharlet Salina M.D.   On: 07/11/2021 17:14    Assessment/Plan Active Problems:   Acute on chronic respiratory failure with hypoxia (HCC)   Transverse myelitis (HCC)   Severe anxiety   DVT of axillary vein, acute (HCC)   Acute on chronic respiratory failure hypoxia patient is going to continue with T collar on 28% FiO2 the patient's  goal is for 24 hours today Transverse myelitis no change continue with supportive care DVT treated Severe anxiety medical management Tracheostomy and continue to wean hopefully will be able to do PMV and capping soon   I have personally seen and evaluated the patient, evaluated laboratory and imaging results, formulated the assessment and plan and placed orders. The Patient requires high complexity decision making with multiple systems involvement.  Rounds were done with the Respiratory Therapy Director and Staff therapists and discussed with nursing staff also.  Yevonne Pax, MD [facility] Pulmonary  Critical Care Medicine Sleep Medicine

## 2021-07-14 DIAGNOSIS — G373 Acute transverse myelitis in demyelinating disease of central nervous system: Secondary | ICD-10-CM | POA: Diagnosis not present

## 2021-07-14 DIAGNOSIS — J9621 Acute and chronic respiratory failure with hypoxia: Secondary | ICD-10-CM | POA: Diagnosis not present

## 2021-07-14 DIAGNOSIS — F419 Anxiety disorder, unspecified: Secondary | ICD-10-CM | POA: Diagnosis not present

## 2021-07-14 DIAGNOSIS — I82A19 Acute embolism and thrombosis of unspecified axillary vein: Secondary | ICD-10-CM | POA: Diagnosis not present

## 2021-07-14 LAB — PROTIME-INR
INR: 1.1 (ref 0.8–1.2)
Prothrombin Time: 13.9 seconds (ref 11.4–15.2)

## 2021-07-14 NOTE — Progress Notes (Signed)
Pulmonary Critical Care Medicine [facility] [place]   PULMONARY CRITICAL CARE SERVICE  PROGRESS NOTE     Dominic Benjamin  BMW:[id number]  DOB: [birth year]   DOA: 07/01/2021  Referring Physician: Luna Kitchens, MD  HPI: Dominic Benjamin is a 55 y.o. male being followed for ventilator/airway/oxygen weaning Acute on Chronic Respiratory Failure.  Right now is resting comfortably without distress at this time has been on T collar on 28% FiO2 goal is for 48 hours  Medications: Reviewed on Rounds  Physical Exam:  Vitals: Temperature 96.9 pulse 54 respiratory 19 blood pressure is 164/94 saturations 97%  Ventilator Settings on T collar FiO2 28%  General: Comfortable at this time Neck: supple Cardiovascular: no malignant arrhythmias Respiratory: No rhonchi very coarse breath sounds Skin: no rash seen on limited exam Musculoskeletal: No gross abnormality Psychiatric:unable to assess Neurologic:no involuntary movements         Lab Data:   Basic Metabolic Panel: Recent Labs  Lab 07/08/21 0519 07/10/21 0704 07/10/21 1035 07/12/21 0514 07/13/21 0525  NA 145 142  --  137  --   K 5.0 5.4* 5.0 5.6* 4.0  CL 108 107  --  103  --   CO2 28 26  --  27  --   GLUCOSE 257* 274*  --  276*  --   BUN 56* 59*  --  50*  --   CREATININE 0.69 0.48*  --  0.45*  --   CALCIUM 9.4 9.5  --  9.2  --   MG 2.0  --   --   --   --     ABG: No results for input(s): PHART, PCO2ART, PO2ART, HCO3, O2SAT in the last 168 hours.  Liver Function Tests: No results for input(s): AST, ALT, ALKPHOS, BILITOT, PROT, ALBUMIN in the last 168 hours. No results for input(s): LIPASE, AMYLASE in the last 168 hours. No results for input(s): AMMONIA in the last 168 hours.  CBC: Recent Labs  Lab 07/08/21 0519 07/10/21 0704 07/12/21 0514  WBC 10.3 11.1* 13.1*  HGB 10.8* 10.7* 12.3*  HCT 36.4* 35.8* 39.8  MCV 89.7 89.9 88.6  PLT 195 137* 131*    Cardiac Enzymes: No results for input(s):  CKTOTAL, CKMB, CKMBINDEX, TROPONINI in the last 168 hours.  BNP (last 3 results) No results for input(s): BNP in the last 8760 hours.  ProBNP (last 3 results) No results for input(s): PROBNP in the last 8760 hours.  Radiological Exams: No results found.  Assessment/Plan Active Problems:   Acute on chronic respiratory failure with hypoxia (HCC)   Transverse myelitis (HCC)   Severe anxiety   DVT of axillary vein, acute (HCC)   Acute on chronic respiratory failure hypoxia plan is to continue to wean on T collar trials the goal will be 48 hours as noted above Transverse myelitis supportive care Severe anxiety better controlled DVT treated continue to follow along closely Tracheostomy will remain in place   I have personally seen and evaluated the patient, evaluated laboratory and imaging results, formulated the assessment and plan and placed orders. The Patient requires high complexity decision making with multiple systems involvement.  Rounds were done with the Respiratory Therapy Director and Staff therapists and discussed with nursing staff also.  Yevonne Pax, MD [facility] Pulmonary Critical Care Medicine Sleep Medicine

## 2021-07-15 DIAGNOSIS — G373 Acute transverse myelitis in demyelinating disease of central nervous system: Secondary | ICD-10-CM | POA: Diagnosis not present

## 2021-07-15 DIAGNOSIS — J9621 Acute and chronic respiratory failure with hypoxia: Secondary | ICD-10-CM | POA: Diagnosis not present

## 2021-07-15 DIAGNOSIS — I82A19 Acute embolism and thrombosis of unspecified axillary vein: Secondary | ICD-10-CM | POA: Diagnosis not present

## 2021-07-15 DIAGNOSIS — F419 Anxiety disorder, unspecified: Secondary | ICD-10-CM | POA: Diagnosis not present

## 2021-07-15 LAB — PROTIME-INR
INR: 1.2 (ref 0.8–1.2)
Prothrombin Time: 15.6 seconds — ABNORMAL HIGH (ref 11.4–15.2)

## 2021-07-15 NOTE — Progress Notes (Signed)
Pulmonary Critical Care Medicine [facility] [place]   PULMONARY CRITICAL CARE SERVICE  PROGRESS NOTE     Dominic Benjamin  DIY:[id number]  DOB: 06/17/1966   DOA: 07/01/2021  Referring Physician: Luna Kitchens, MD  HPI: Dominic Benjamin is a 55 y.o. male being followed for ventilator/airway/oxygen weaning Acute on Chronic Respiratory Failure.  Patient continues to wean his vent on T collar doing fairly well on 28% FiO2  Medications: Reviewed on Rounds  Physical Exam:  Vitals: Temperature is 96 pulse 45 respiratory rate is 17 blood pressure 152/80 saturations 98%  Ventilator Settings on T collar FiO2 28%  General: Comfortable at this time Neck: supple Cardiovascular: no malignant arrhythmias Respiratory: No rhonchi very coarse breath sounds Skin: no rash seen on limited exam Musculoskeletal: No gross abnormality Psychiatric:unable to assess Neurologic:no involuntary movements         Lab Data:   Basic Metabolic Panel: Recent Labs  Lab 07/10/21 0704 07/10/21 1035 07/12/21 0514 07/13/21 0525  NA 142  --  137  --   K 5.4* 5.0 5.6* 4.0  CL 107  --  103  --   CO2 26  --  27  --   GLUCOSE 274*  --  276*  --   BUN 59*  --  50*  --   CREATININE 0.48*  --  0.45*  --   CALCIUM 9.5  --  9.2  --     ABG: No results for input(s): PHART, PCO2ART, PO2ART, HCO3, O2SAT in the last 168 hours.  Liver Function Tests: No results for input(s): AST, ALT, ALKPHOS, BILITOT, PROT, ALBUMIN in the last 168 hours. No results for input(s): LIPASE, AMYLASE in the last 168 hours. No results for input(s): AMMONIA in the last 168 hours.  CBC: Recent Labs  Lab 07/10/21 0704 07/12/21 0514  WBC 11.1* 13.1*  HGB 10.7* 12.3*  HCT 35.8* 39.8  MCV 89.9 88.6  PLT 137* 131*    Cardiac Enzymes: No results for input(s): CKTOTAL, CKMB, CKMBINDEX, TROPONINI in the last 168 hours.  BNP (last 3 results) No results for input(s): BNP in the last 8760 hours.  ProBNP (last  3 results) No results for input(s): PROBNP in the last 8760 hours.  Radiological Exams: No results found.  Assessment/Plan Active Problems:   Acute on chronic respiratory failure with hypoxia (HCC)   Transverse myelitis (HCC)   Severe anxiety   DVT of axillary vein, acute (HCC)   Acute on chronic respiratory failure with hypoxia patient is on T collar on 28% FiO2 saturations are clear plan is going to be to continue with the T-piece as ordered continue secretion management pulmonary toilet. Transverse myelitis at baseline supportive care Severe anxiety controlled we will continue to follow along DVT treated Tracheostomy will remain in place for now   I have personally seen and evaluated the patient, evaluated laboratory and imaging results, formulated the assessment and plan and placed orders. The Patient requires high complexity decision making with multiple systems involvement.  Rounds were done with the Respiratory Therapy Director and Staff therapists and discussed with nursing staff also.  Yevonne Pax, MD [facility] Pulmonary Critical Care Medicine Sleep Medicine

## 2021-07-16 LAB — PROTIME-INR
INR: 1.7 — ABNORMAL HIGH (ref 0.8–1.2)
Prothrombin Time: 20.1 seconds — ABNORMAL HIGH (ref 11.4–15.2)

## 2021-07-17 LAB — PROTIME-INR
INR: 2.2 — ABNORMAL HIGH (ref 0.8–1.2)
Prothrombin Time: 24.6 seconds — ABNORMAL HIGH (ref 11.4–15.2)

## 2021-07-18 DIAGNOSIS — F419 Anxiety disorder, unspecified: Secondary | ICD-10-CM | POA: Diagnosis not present

## 2021-07-18 DIAGNOSIS — G373 Acute transverse myelitis in demyelinating disease of central nervous system: Secondary | ICD-10-CM | POA: Diagnosis not present

## 2021-07-18 DIAGNOSIS — J9621 Acute and chronic respiratory failure with hypoxia: Secondary | ICD-10-CM | POA: Diagnosis not present

## 2021-07-18 DIAGNOSIS — I82A19 Acute embolism and thrombosis of unspecified axillary vein: Secondary | ICD-10-CM | POA: Diagnosis not present

## 2021-07-18 LAB — PROTIME-INR
INR: 2.4 — ABNORMAL HIGH (ref 0.8–1.2)
Prothrombin Time: 26.5 seconds — ABNORMAL HIGH (ref 11.4–15.2)

## 2021-07-18 NOTE — Progress Notes (Signed)
Pulmonary Critical Care Medicine [facility] [place]   PULMONARY CRITICAL CARE SERVICE  PROGRESS NOTE     Lauri Till  OEU:[id number]  DOB: 04-Aug-1966   DOA: 07/01/2021  Referring Physician: Luna Kitchens, MD  HPI: Dominic Benjamin is a 55 y.o. male being followed for ventilator/airway/oxygen weaning Acute on Chronic Respiratory Failure.  Patient is on the T collar on 28% FiO2 at baseline Medications: Reviewed on Rounds  Physical Exam:  Vitals: Temperature 96.9 pulse 49 respiratory 19 blood pressure is 138/69 saturations 98%  Ventilator Settings on spontaneous breathing on T-bar  General: Comfortable at this time Neck: supple Cardiovascular: no malignant arrhythmias Respiratory: No rhonchi very coarse breath sounds Skin: no rash seen on limited exam Musculoskeletal: No gross abnormality Psychiatric:unable to assess Neurologic:no involuntary movements         Lab Data:   Basic Metabolic Panel: Recent Labs  Lab 07/12/21 0514 07/13/21 0525  NA 137  --   K 5.6* 4.0  CL 103  --   CO2 27  --   GLUCOSE 276*  --   BUN 50*  --   CREATININE 0.45*  --   CALCIUM 9.2  --     ABG: No results for input(s): PHART, PCO2ART, PO2ART, HCO3, O2SAT in the last 168 hours.  Liver Function Tests: No results for input(s): AST, ALT, ALKPHOS, BILITOT, PROT, ALBUMIN in the last 168 hours. No results for input(s): LIPASE, AMYLASE in the last 168 hours. No results for input(s): AMMONIA in the last 168 hours.  CBC: Recent Labs  Lab 07/12/21 0514  WBC 13.1*  HGB 12.3*  HCT 39.8  MCV 88.6  PLT 131*    Cardiac Enzymes: No results for input(s): CKTOTAL, CKMB, CKMBINDEX, TROPONINI in the last 168 hours.  BNP (last 3 results) No results for input(s): BNP in the last 8760 hours.  ProBNP (last 3 results) No results for input(s): PROBNP in the last 8760 hours.  Radiological Exams: No results found.  Assessment/Plan Active Problems:   Acute on chronic  respiratory failure with hypoxia (HCC)   Transverse myelitis (HCC)   Severe anxiety   DVT of axillary vein, acute (HCC)   Acute on chronic respiratory failure with hypoxia at this time patient is going to continue with T collar trials right now is on 28% FiO2 patient is at baseline Transverse myelitis no change we will continue to follow along closely Severe anxiety no issues are noted under good control DVT treated Tracheostomy will need to remain in place   I have personally seen and evaluated the patient, evaluated laboratory and imaging results, formulated the assessment and plan and placed orders. The Patient requires high complexity decision making with multiple systems involvement.  Rounds were done with the Respiratory Therapy Director and Staff therapists and discussed with nursing staff also.  Yevonne Pax, MD [facility] Pulmonary Critical Care Medicine Sleep Medicine

## 2021-07-19 DIAGNOSIS — J9621 Acute and chronic respiratory failure with hypoxia: Secondary | ICD-10-CM | POA: Diagnosis not present

## 2021-07-19 DIAGNOSIS — I82A19 Acute embolism and thrombosis of unspecified axillary vein: Secondary | ICD-10-CM | POA: Diagnosis not present

## 2021-07-19 DIAGNOSIS — F419 Anxiety disorder, unspecified: Secondary | ICD-10-CM | POA: Diagnosis not present

## 2021-07-19 DIAGNOSIS — G373 Acute transverse myelitis in demyelinating disease of central nervous system: Secondary | ICD-10-CM | POA: Diagnosis not present

## 2021-07-19 LAB — CBC
HCT: 39.4 % (ref 39.0–52.0)
Hemoglobin: 12 g/dL — ABNORMAL LOW (ref 13.0–17.0)
MCH: 26.9 pg (ref 26.0–34.0)
MCHC: 30.5 g/dL (ref 30.0–36.0)
MCV: 88.3 fL (ref 80.0–100.0)
Platelets: 128 10*3/uL — ABNORMAL LOW (ref 150–400)
RBC: 4.46 MIL/uL (ref 4.22–5.81)
RDW: 19 % — ABNORMAL HIGH (ref 11.5–15.5)
WBC: 14.4 10*3/uL — ABNORMAL HIGH (ref 4.0–10.5)
nRBC: 0 % (ref 0.0–0.2)

## 2021-07-19 LAB — MAGNESIUM: Magnesium: 1.9 mg/dL (ref 1.7–2.4)

## 2021-07-19 LAB — BASIC METABOLIC PANEL
Anion gap: 10 (ref 5–15)
BUN: 29 mg/dL — ABNORMAL HIGH (ref 6–20)
CO2: 25 mmol/L (ref 22–32)
Calcium: 9.1 mg/dL (ref 8.9–10.3)
Chloride: 103 mmol/L (ref 98–111)
Creatinine, Ser: 0.38 mg/dL — ABNORMAL LOW (ref 0.61–1.24)
GFR, Estimated: >60 mL/min (ref >60)
Glucose, Bld: 227 mg/dL — ABNORMAL HIGH (ref 70–99)
Potassium: 4.9 mmol/L (ref 3.5–5.1)
Sodium: 138 mmol/L (ref 135–145)

## 2021-07-19 LAB — PROTIME-INR
INR: 2.3 — ABNORMAL HIGH (ref 0.8–1.2)
Prothrombin Time: 25.6 seconds — ABNORMAL HIGH (ref 11.4–15.2)

## 2021-07-19 NOTE — Progress Notes (Signed)
Pulmonary Critical Care Medicine [facility] [place]   PULMONARY CRITICAL CARE SERVICE  PROGRESS NOTE     Dominic Benjamin  [id number]  DOB: [birth year]   DOA: 07/01/2021  Referring Physician: Luna Kitchens, MD  HPI: Dominic Benjamin is a 55 y.o. male being followed for ventilator/airway/oxygen weaning Acute on Chronic Respiratory Failure.  Patient is resting comfortably right now without distress has been on T collar on 28% FiO2 liberated from the ventilator  Medications: Reviewed on Rounds  Physical Exam:  Vitals: Temperature is 97.6 pulse of 61 respiratory is 18 blood pressure is 126/65  Ventilator Settings on T collar FiO2 28%  General: Comfortable at this time Neck: supple Cardiovascular: no malignant arrhythmias Respiratory: No rhonchi no rales Skin: no rash seen on limited exam Musculoskeletal: No gross abnormality Psychiatric:unable to assess Neurologic:no involuntary movements         Lab Data:   Basic Metabolic Panel: Recent Labs  Lab 07/13/21 0525 07/19/21 0331  NA  --  138  K 4.0 4.9  CL  --  103  CO2  --  25  GLUCOSE  --  227*  BUN  --  29*  CREATININE  --  0.38*  CALCIUM  --  9.1  MG  --  1.9    ABG: No results for input(s): PHART, PCO2ART, PO2ART, HCO3, O2SAT in the last 168 hours.  Liver Function Tests: No results for input(s): AST, ALT, ALKPHOS, BILITOT, PROT, ALBUMIN in the last 168 hours. No results for input(s): LIPASE, AMYLASE in the last 168 hours. No results for input(s): AMMONIA in the last 168 hours.  CBC: Recent Labs  Lab 07/19/21 0331  WBC 14.4*  HGB 12.0*  HCT 39.4  MCV 88.3  PLT 128*    Cardiac Enzymes: No results for input(s): CKTOTAL, CKMB, CKMBINDEX, TROPONINI in the last 168 hours.  BNP (last 3 results) No results for input(s): BNP in the last 8760 hours.  ProBNP (last 3 results) No results for input(s): PROBNP in the last 8760 hours.  Radiological Exams: No results  found.  Assessment/Plan Active Problems:   Acute on chronic respiratory failure with hypoxia (HCC)   Transverse myelitis (HCC)   Severe anxiety   DVT of axillary vein, acute (HCC)   Acute on chronic respiratory failure with hypoxia doing well plan is going to be to continue to advance the weaning as tolerated.  Patient's secretions remain an issue and will need to probably stay with a tracheostomy also because of his neurological issues going on Transverse myelitis supportive care prognosis is guarded Severe anxiety patient is at baseline we will continue to monitor DVT treated supportive care    I have personally seen and evaluated the patient, evaluated laboratory and imaging results, formulated the assessment and plan and placed orders. The Patient requires high complexity decision making with multiple systems involvement.  Rounds were done with the Respiratory Therapy Director and Staff therapists and discussed with nursing staff also.  Dominic Pax, MD [facility] Pulmonary Critical Care Medicine Sleep Medicine

## 2021-07-20 ENCOUNTER — Other Ambulatory Visit (HOSPITAL_COMMUNITY): Payer: Medicare Other

## 2021-07-20 DIAGNOSIS — G373 Acute transverse myelitis in demyelinating disease of central nervous system: Secondary | ICD-10-CM | POA: Diagnosis not present

## 2021-07-20 DIAGNOSIS — I82A19 Acute embolism and thrombosis of unspecified axillary vein: Secondary | ICD-10-CM | POA: Diagnosis not present

## 2021-07-20 DIAGNOSIS — J9621 Acute and chronic respiratory failure with hypoxia: Secondary | ICD-10-CM | POA: Diagnosis not present

## 2021-07-20 DIAGNOSIS — F419 Anxiety disorder, unspecified: Secondary | ICD-10-CM | POA: Diagnosis not present

## 2021-07-20 LAB — PROTIME-INR
INR: 2.4 — ABNORMAL HIGH (ref 0.8–1.2)
Prothrombin Time: 26.5 seconds — ABNORMAL HIGH (ref 11.4–15.2)

## 2021-07-20 NOTE — Progress Notes (Signed)
Pulmonary Critical Care Medicine [facility] [place]   PULMONARY CRITICAL CARE SERVICE  PROGRESS NOTE     Dominic Benjamin  [id number]  DOB: 1966/06/28   DOA: 07/01/2021  Referring Physician: Luna Kitchens, MD  HPI: Dominic Benjamin is a 55 y.o. male being followed for ventilator/airway/oxygen weaning Acute on Chronic Respiratory Failure.  Patient is currently on T collar on 40% FiO2.  Secretions are noted to be increased  Medications: Reviewed on Rounds  Physical Exam:  Vitals: Temperature is 96.0 pulse 85 respiratory rate 28 blood pressure is 116/63 saturations 94%  Ventilator Settings on T collar FiO2 is 40%  General: Comfortable at this time Neck: supple Cardiovascular: no malignant arrhythmias Respiratory: Scattered rhonchi expansion is equal Skin: no rash seen on limited exam Musculoskeletal: No gross abnormality Psychiatric:unable to assess Neurologic:no involuntary movements         Lab Data:   Basic Metabolic Panel: Recent Labs  Lab 07/19/21 0331  NA 138  K 4.9  CL 103  CO2 25  GLUCOSE 227*  BUN 29*  CREATININE 0.38*  CALCIUM 9.1  MG 1.9    ABG: No results for input(s): PHART, PCO2ART, PO2ART, HCO3, O2SAT in the last 168 hours.  Liver Function Tests: No results for input(s): AST, ALT, ALKPHOS, BILITOT, PROT, ALBUMIN in the last 168 hours. No results for input(s): LIPASE, AMYLASE in the last 168 hours. No results for input(s): AMMONIA in the last 168 hours.  CBC: Recent Labs  Lab 07/19/21 0331  WBC 14.4*  HGB 12.0*  HCT 39.4  MCV 88.3  PLT 128*    Cardiac Enzymes: No results for input(s): CKTOTAL, CKMB, CKMBINDEX, TROPONINI in the last 168 hours.  BNP (last 3 results) No results for input(s): BNP in the last 8760 hours.  ProBNP (last 3 results) No results for input(s): PROBNP in the last 8760 hours.  Radiological Exams: No results found.  Assessment/Plan Active Problems:   Acute on chronic respiratory  failure with hypoxia (HCC)   Transverse myelitis (HCC)   Severe anxiety   DVT of axillary vein, acute (HCC)   Acute on chronic respiratory failure hypoxia we will continue with the T collar because of some issues with increased oxygen requirement suggested a follow-up chest x-ray to be done Transverse myelitis no change overall continue with supportive care Severe anxiety has been treated we will continue to follow along closely. DVT treated supportive care Tracheostomy will remain in place   I have personally seen and evaluated the patient, evaluated laboratory and imaging results, formulated the assessment and plan and placed orders. The Patient requires high complexity decision making with multiple systems involvement.  Rounds were done with the Respiratory Therapy Director and Staff therapists and discussed with nursing staff also.  Yevonne Pax, MD [facility] Pulmonary Critical Care Medicine Sleep Medicine

## 2021-07-21 DIAGNOSIS — G373 Acute transverse myelitis in demyelinating disease of central nervous system: Secondary | ICD-10-CM | POA: Diagnosis not present

## 2021-07-21 DIAGNOSIS — J9621 Acute and chronic respiratory failure with hypoxia: Secondary | ICD-10-CM | POA: Diagnosis not present

## 2021-07-21 DIAGNOSIS — I82A19 Acute embolism and thrombosis of unspecified axillary vein: Secondary | ICD-10-CM | POA: Diagnosis not present

## 2021-07-21 DIAGNOSIS — F419 Anxiety disorder, unspecified: Secondary | ICD-10-CM | POA: Diagnosis not present

## 2021-07-21 LAB — PROTIME-INR
INR: 3.1 — ABNORMAL HIGH (ref 0.8–1.2)
Prothrombin Time: 31.8 seconds — ABNORMAL HIGH (ref 11.4–15.2)

## 2021-07-21 NOTE — Progress Notes (Signed)
Pulmonary Critical Care Medicine [facility] [place]   PULMONARY CRITICAL CARE SERVICE  PROGRESS NOTE     Dominic Benjamin  [id number]  DOB: [birth year]   DOA: 07/01/2021  Referring Physician: Luna Kitchens, MD  HPI: Dominic Benjamin is a 55 y.o. male being followed for ventilator/airway/oxygen weaning Acute on Chronic Respiratory Failure.  Patient is resting comfortably right now without distress at this time no fevers are noted  Medications: Reviewed on Rounds  Physical Exam:  Vitals: Temperature is 97.1 pulse 56 respiratory rate is 23 blood pressure is 132/71 saturations 98%  Ventilator Settings off ventilator right now on T collar with 40% FiO2  General: Comfortable at this time Neck: supple Cardiovascular: no malignant arrhythmias Respiratory: Scattered rhonchi expansion is equal Skin: no rash seen on limited exam Musculoskeletal: No gross abnormality Psychiatric:unable to assess Neurologic:no involuntary movements         Lab Data:   Basic Metabolic Panel: Recent Labs  Lab 07/19/21 0331  NA 138  K 4.9  CL 103  CO2 25  GLUCOSE 227*  BUN 29*  CREATININE 0.38*  CALCIUM 9.1  MG 1.9    ABG: No results for input(s): PHART, PCO2ART, PO2ART, HCO3, O2SAT in the last 168 hours.  Liver Function Tests: No results for input(s): AST, ALT, ALKPHOS, BILITOT, PROT, ALBUMIN in the last 168 hours. No results for input(s): LIPASE, AMYLASE in the last 168 hours. No results for input(s): AMMONIA in the last 168 hours.  CBC: Recent Labs  Lab 07/19/21 0331  WBC 14.4*  HGB 12.0*  HCT 39.4  MCV 88.3  PLT 128*    Cardiac Enzymes: No results for input(s): CKTOTAL, CKMB, CKMBINDEX, TROPONINI in the last 168 hours.  BNP (last 3 results) No results for input(s): BNP in the last 8760 hours.  ProBNP (last 3 results) No results for input(s): PROBNP in the last 8760 hours.  Radiological Exams: DG CHEST PORT 1 VIEW  Result Date:  07/20/2021 CLINICAL DATA:  Respiratory failure (HCC) J96.90 (ICD-10-CM) EXAM: PORTABLE CHEST 1 VIEW COMPARISON:  07/01/2021. FINDINGS: New airspace opacities in the lateral left lung base. Tracheostomy tube tip projects near the midline at the thoracic inlet with slight patient rotation and lordotic positioning. No visible pleural effusion or pneumothorax on this single semi erect radiograph. Cardiomediastinal silhouette is within normal limits and similar to prior. IMPRESSION: New airspace opacities in the lateral left lung base, concerning for aspiration and/or pneumonia. Recommend follow-up imaging to resolution. Electronically Signed   By: Feliberto Harts M.D.   On: 07/20/2021 09:20    Assessment/Plan Active Problems:   Acute on chronic respiratory failure with hypoxia (HCC)   Transverse myelitis (HCC)   Severe anxiety   DVT of axillary vein, acute (HCC)   Acute on chronic respiratory failure hypoxia plan is going to be to continue with the T collar try to titrate oxygen down as tolerated continue pulmonary toilet and supportive care. Transverse myelitis no change we will continue with supportive care Severe anxiety under better control DVT treated continue with supportive care Tracheostomy remains in place   I have personally seen and evaluated the patient, evaluated laboratory and imaging results, formulated the assessment and plan and placed orders. The Patient requires high complexity decision making with multiple systems involvement.  Rounds were done with the Respiratory Therapy Director and Staff therapists and discussed with nursing staff also.  Yevonne Pax, MD [facility] Pulmonary Critical Care Medicine Sleep Medicine

## 2021-07-22 LAB — BASIC METABOLIC PANEL
Anion gap: 6 (ref 5–15)
BUN: 29 mg/dL — ABNORMAL HIGH (ref 6–20)
CO2: 26 mmol/L (ref 22–32)
Calcium: 8.9 mg/dL (ref 8.9–10.3)
Chloride: 106 mmol/L (ref 98–111)
Creatinine, Ser: <0.3 mg/dL — ABNORMAL LOW (ref 0.61–1.24)
Glucose, Bld: 155 mg/dL — ABNORMAL HIGH (ref 70–99)
Potassium: 5 mmol/L (ref 3.5–5.1)
Sodium: 138 mmol/L (ref 135–145)

## 2021-07-22 LAB — CBC
HCT: 38.6 % — ABNORMAL LOW (ref 39.0–52.0)
Hemoglobin: 12 g/dL — ABNORMAL LOW (ref 13.0–17.0)
MCH: 28 pg (ref 26.0–34.0)
MCHC: 31.1 g/dL (ref 30.0–36.0)
MCV: 90 fL (ref 80.0–100.0)
Platelets: 88 10*3/uL — ABNORMAL LOW (ref 150–400)
RBC: 4.29 MIL/uL (ref 4.22–5.81)
RDW: 19.6 % — ABNORMAL HIGH (ref 11.5–15.5)
WBC: 10.5 10*3/uL (ref 4.0–10.5)
nRBC: 0 % (ref 0.0–0.2)

## 2021-07-22 LAB — BLOOD GAS, ARTERIAL
Acid-Base Excess: 2.8 mmol/L — ABNORMAL HIGH (ref 0.0–2.0)
Bicarbonate: 26 mmol/L (ref 20.0–28.0)
FIO2: 98
O2 Saturation: 72.3 %
Patient temperature: 36.2
pCO2 arterial: 33 mmHg (ref 32.0–48.0)
pH, Arterial: 7.504 — ABNORMAL HIGH (ref 7.350–7.450)
pO2, Arterial: 35.2 mmHg — CL (ref 83.0–108.0)

## 2021-07-22 LAB — PROTIME-INR
INR: 2.6 — ABNORMAL HIGH (ref 0.8–1.2)
Prothrombin Time: 27.4 seconds — ABNORMAL HIGH (ref 11.4–15.2)

## 2021-07-23 ENCOUNTER — Other Ambulatory Visit (HOSPITAL_COMMUNITY): Payer: Medicare Other

## 2021-07-23 LAB — BLOOD GAS, ARTERIAL
Acid-Base Excess: 2.3 mmol/L — ABNORMAL HIGH (ref 0.0–2.0)
Bicarbonate: 26.8 mmol/L (ref 20.0–28.0)
Drawn by: 164
FIO2: 90
O2 Saturation: 94.9 %
Patient temperature: 37
pCO2 arterial: 45.4 mmHg (ref 32.0–48.0)
pH, Arterial: 7.389 (ref 7.350–7.450)
pO2, Arterial: 75.2 mmHg — ABNORMAL LOW (ref 83.0–108.0)

## 2021-07-23 LAB — COMPREHENSIVE METABOLIC PANEL
ALT: 54 U/L — ABNORMAL HIGH (ref 0–44)
AST: 39 U/L (ref 15–41)
Albumin: 3 g/dL — ABNORMAL LOW (ref 3.5–5.0)
Alkaline Phosphatase: 128 U/L — ABNORMAL HIGH (ref 38–126)
Anion gap: 7 (ref 5–15)
BUN: 36 mg/dL — ABNORMAL HIGH (ref 6–20)
CO2: 26 mmol/L (ref 22–32)
Calcium: 9 mg/dL (ref 8.9–10.3)
Chloride: 106 mmol/L (ref 98–111)
Creatinine, Ser: 0.35 mg/dL — ABNORMAL LOW (ref 0.61–1.24)
GFR, Estimated: >60 mL/min (ref >60)
Glucose, Bld: 167 mg/dL — ABNORMAL HIGH (ref 70–99)
Potassium: 4.5 mmol/L (ref 3.5–5.1)
Sodium: 139 mmol/L (ref 135–145)
Total Bilirubin: 0.9 mg/dL (ref 0.3–1.2)
Total Protein: 5.4 g/dL — ABNORMAL LOW (ref 6.5–8.1)

## 2021-07-23 LAB — CBC
HCT: 43.4 % (ref 39.0–52.0)
Hemoglobin: 13.6 g/dL (ref 13.0–17.0)
MCH: 27.9 pg (ref 26.0–34.0)
MCHC: 31.3 g/dL (ref 30.0–36.0)
MCV: 88.9 fL (ref 80.0–100.0)
Platelets: 95 10*3/uL — ABNORMAL LOW (ref 150–400)
RBC: 4.88 MIL/uL (ref 4.22–5.81)
RDW: 19.8 % — ABNORMAL HIGH (ref 11.5–15.5)
WBC: 23.1 10*3/uL — ABNORMAL HIGH (ref 4.0–10.5)
nRBC: 0 % (ref 0.0–0.2)

## 2021-07-23 LAB — PROTIME-INR
INR: 1.6 — ABNORMAL HIGH (ref 0.8–1.2)
Prothrombin Time: 19.1 seconds — ABNORMAL HIGH (ref 11.4–15.2)

## 2021-07-23 LAB — LACTIC ACID, PLASMA: Lactic Acid, Venous: 2.1 mmol/L (ref 0.5–1.9)

## 2021-07-24 LAB — VANCOMYCIN, TROUGH: Vancomycin Tr: 24 ug/mL (ref 15–20)

## 2021-07-24 LAB — PROTIME-INR
INR: 1.5 — ABNORMAL HIGH (ref 0.8–1.2)
Prothrombin Time: 18 seconds — ABNORMAL HIGH (ref 11.4–15.2)

## 2021-07-25 LAB — CBC
HCT: 37.9 % — ABNORMAL LOW (ref 39.0–52.0)
Hemoglobin: 11.6 g/dL — ABNORMAL LOW (ref 13.0–17.0)
MCH: 27.6 pg (ref 26.0–34.0)
MCHC: 30.6 g/dL (ref 30.0–36.0)
MCV: 90 fL (ref 80.0–100.0)
Platelets: 62 10*3/uL — ABNORMAL LOW (ref 150–400)
RBC: 4.21 MIL/uL — ABNORMAL LOW (ref 4.22–5.81)
RDW: 19.6 % — ABNORMAL HIGH (ref 11.5–15.5)
WBC: 14 10*3/uL — ABNORMAL HIGH (ref 4.0–10.5)
nRBC: 0 % (ref 0.0–0.2)

## 2021-07-25 LAB — PROTIME-INR
INR: 1.4 — ABNORMAL HIGH (ref 0.8–1.2)
Prothrombin Time: 16.9 seconds — ABNORMAL HIGH (ref 11.4–15.2)

## 2021-07-25 LAB — BASIC METABOLIC PANEL
Anion gap: 8 (ref 5–15)
BUN: 44 mg/dL — ABNORMAL HIGH (ref 6–20)
CO2: 26 mmol/L (ref 22–32)
Calcium: 9 mg/dL (ref 8.9–10.3)
Chloride: 106 mmol/L (ref 98–111)
Creatinine, Ser: 0.33 mg/dL — ABNORMAL LOW (ref 0.61–1.24)
GFR, Estimated: >60 mL/min (ref >60)
Glucose, Bld: 215 mg/dL — ABNORMAL HIGH (ref 70–99)
Potassium: 4.2 mmol/L (ref 3.5–5.1)
Sodium: 140 mmol/L (ref 135–145)

## 2021-07-25 LAB — VANCOMYCIN, TROUGH: Vancomycin Tr: 7 ug/mL — ABNORMAL LOW (ref 15–20)

## 2021-07-25 NOTE — Consult Note (Signed)
Referring Physician: S. Manson Passey.MD  Dominic Benjamin is an 55 y.o. male.                       Chief Complaint: Sinus Bradycardia  HPI: 55 years old white male with PMH of Transverse Myelitis with quadriplegia, Type 2 DM, Sepsis, stroke, atrial fibrillation, pneumonia, acute on chronic respiratory failure and tracheostomy had episodes of hypotension and sinus bradycardia. He is not on AV node blocking medications. Holding Sedatives today has helped improve heart rate and blood pressure.  Limited echocardiogram on 05/29/2021 showed normal LV systolic function.   Past medical history: As per HPI.   PSH:   *Anesth lower leg procedure Right 06/01/2016 right ankle fusion   Appendectomy   Back surgery   Colonoscopy N/A 12/22/2015  Procedure: COLONOSCOPY WITH ANESTHESIA ; Surgeon: Tonia Ghent, MD; Location: [facility] ENDO; Service: Gastroenterology; Laterality: N/A;   Heel spur surgery   Right hydrocelectomy   No family history on file.  Social History:  Single. He has no history on file for tobacco use, alcohol use, and drug use. Positive snuff use.  Allergies: Not on File  No medications prior to admission.  See list on chart.  Results for orders placed or performed during the hospital encounter of 07/01/21 (from the past 48 hour(s))  Protime-INR     Status: Abnormal   Collection Time: 07/24/21  2:41 AM  Result Value Ref Range   Prothrombin Time 18.0 (H) 11.4 - 15.2 seconds   INR 1.5 (H) 0.8 - 1.2    Comment: (NOTE) INR goal varies based on device and disease states. Performed at [facility], [address] [place], [place] [address]   Vancomycin, trough     Status: Abnormal   Collection Time: 07/24/21 12:08 PM  Result Value Ref Range   Vancomycin Tr 24 (HH) 15 - 20 ug/mL    Comment: CRITICAL RESULT CALLED TO, READ BACK BY AND VERIFIED WITH: K YOUNG RN BY SSTEPHENS [id number] Performed at [facility], [address] [place], [place] [address]   Protime-INR      Status: Abnormal   Collection Time: 07/25/21  3:24 AM  Result Value Ref Range   Prothrombin Time 16.9 (H) 11.4 - 15.2 seconds   INR 1.4 (H) 0.8 - 1.2    Comment: (NOTE) INR goal varies based on device and disease states. Performed at [facility], [address], [place], [place] [address]   Vancomycin, trough     Status: Abnormal   Collection Time: 07/25/21 11:17 AM  Result Value Ref Range   Vancomycin Tr 7 (L) 15 - 20 ug/mL    Comment: Performed at [facility], [address], [place], [place] [address]  Basic metabolic panel     Status: Abnormal   Collection Time: 07/25/21  2:51 PM  Result Value Ref Range   Sodium 140 135 - 145 mmol/L   Potassium 4.2 3.5 - 5.1 mmol/L   Chloride 106 98 - 111 mmol/L   CO2 26 22 - 32 mmol/L   Glucose, Bld 215 (H) 70 - 99 mg/dL    Comment: Glucose reference range applies only to samples taken after fasting for at least 8 hours.   BUN 44 (H) 6 - 20 mg/dL   Creatinine, Ser 1.24 (L) 0.61 - 1.24 mg/dL   Calcium 9.0 8.9 - 58.0 mg/dL   GFR, Estimated >99 >83 mL/min    Comment: (NOTE)  Calculated using the CKD-EPI Creatinine Equation (2021)    Anion gap 8 5 - 15    Comment: Performed at [facility] Lab, [address], [place], [place] [address]  CBC     Status: Abnormal   Collection Time: 07/25/21  2:51 PM  Result Value Ref Range   WBC 14.0 (H) 4.0 - 10.5 K/uL   RBC 4.21 (L) 4.22 - 5.81 MIL/uL   Hemoglobin 11.6 (L) 13.0 - 17.0 g/dL   HCT 35.4 (L) 65.6 - 81.2 %   MCV 90.0 80.0 - 100.0 fL   MCH 27.6 26.0 - 34.0 pg   MCHC 30.6 30.0 - 36.0 g/dL   RDW 75.1 (H) 70.0 - 17.4 %   Platelets 62 (L) 150 - 400 K/uL    Comment: Immature Platelet Fraction may be clinically indicated, consider ordering this additional test [id number] CONSISTENT WITH PREVIOUS RESULT REPEATED TO VERIFY    nRBC 0.0 0.0 - 0.2 %    Comment: Performed at [facility], [address], [place], [place] [address]   No results found.  Review Of Systems As  per HPI.  P: 54, R: 20, BP: 106/60, O2 sat 98 %. On T collar with 40 % FiO2. There were no vitals taken for this visit. There is no height or weight on file to calculate BMI. General appearance: alert, cooperative, appears stated age and moderate respiratory distress Head: Normocephalic, atraumatic. Eyes: Blue eyes, pink conjunctiva, corneas clear.  Neck: No adenopathy, no carotid bruit, no JVD, supple, symmetrical, tracheostomy in place. Lungs: Coarse crackles to auscultation bilaterally. Cardio: Regular rate and rhythm, S1, S2 normal, II/VI systolic murmur, no click, rub or gallop GI: Soft, non-tender; bowel sounds normal; no organomegaly. Extremities: No edema, cyanosis or clubbing. Skin: Warm and dry.  Neurologic: Alert and oriented X 1, quadriplegic.   Assessment/Plan Sinus bradycardia,secondary to vagal inhibition Acute on chronic respiratory failure with hypoxia Transverse Myelitis  Quadriplegia Type 2 DM Atrial fibrillation, paroxysmal Sepsis Pneumonia Hypotension Moderate to severe protein calorie malnutrition  Plan: Agree with decreasing sedation use. Increase oral or IV fluid intake as tolerated.  Time spent: Review of old records, Lab, x-rays, EKG, other cardiac tests, examination, discussion with patient/Nurse/Tech over 70 minutes.  Ricki Rodriguez, MD  07/25/2021, 4:23 PM

## 2021-07-26 ENCOUNTER — Other Ambulatory Visit (HOSPITAL_COMMUNITY): Payer: Medicare Other

## 2021-07-26 DIAGNOSIS — J9621 Acute and chronic respiratory failure with hypoxia: Secondary | ICD-10-CM | POA: Diagnosis not present

## 2021-07-26 DIAGNOSIS — G373 Acute transverse myelitis in demyelinating disease of central nervous system: Secondary | ICD-10-CM | POA: Diagnosis not present

## 2021-07-26 DIAGNOSIS — F419 Anxiety disorder, unspecified: Secondary | ICD-10-CM | POA: Diagnosis not present

## 2021-07-26 DIAGNOSIS — I82A19 Acute embolism and thrombosis of unspecified axillary vein: Secondary | ICD-10-CM | POA: Diagnosis not present

## 2021-07-26 LAB — PROTIME-INR
INR: 1.3 — ABNORMAL HIGH (ref 0.8–1.2)
Prothrombin Time: 16.3 seconds — ABNORMAL HIGH (ref 11.4–15.2)

## 2021-07-26 LAB — VANCOMYCIN, TROUGH: Vancomycin Tr: 33 ug/mL (ref 15–20)

## 2021-07-26 NOTE — Progress Notes (Signed)
Pulmonary Critical Care Medicine [facility] [place]   PULMONARY CRITICAL CARE SERVICE  PROGRESS NOTE     Dominic Benjamin  UMP:[id number]  DOB: February 13, 1966   DOA: 07/01/2021  Referring Physician: Luna Kitchens, MD  HPI: Dominic Benjamin is a 55 y.o. male being followed for ventilator/airway/oxygen weaning Acute on Chronic Respiratory Failure.  Patient was placed back on the ventilator but looks better now also spoke with respiratory therapy and nursing should be able to start back on weaning  Medications: Reviewed on Rounds  Physical Exam:  Vitals: Temperature is 98.4 pulse 52 respiratory to is 30 blood pressure is 110/57 saturations 97%  Ventilator Settings on assist control FiO2 is 50% tidal volume 450 PEEP 5  General: Comfortable at this time Neck: supple Cardiovascular: no malignant arrhythmias Respiratory: Scattered rhonchi expansion is equal Skin: no rash seen on limited exam Musculoskeletal: No gross abnormality Psychiatric:unable to assess Neurologic:no involuntary movements         Lab Data:   Basic Metabolic Panel: Recent Labs  Lab 07/22/21 1639 07/23/21 0334 07/25/21 1451  NA 138 139 140  K 5.0 4.5 4.2  CL 106 106 106  CO2 26 26 26   GLUCOSE 155* 167* 215*  BUN 29* 36* 44*  CREATININE <0.30* 0.35* 0.33*  CALCIUM 8.9 9.0 9.0    ABG: Recent Labs  Lab 07/22/21 2320 07/23/21 0932  PHART 7.504* 7.389  PCO2ART 33.0 45.4  PO2ART 35.2* 75.2*  HCO3 26.0 26.8  O2SAT 72.3 94.9    Liver Function Tests: Recent Labs  Lab 07/23/21 0334  AST 39  ALT 54*  ALKPHOS 128*  BILITOT 0.9  PROT 5.4*  ALBUMIN 3.0*   No results for input(s): LIPASE, AMYLASE in the last 168 hours. No results for input(s): AMMONIA in the last 168 hours.  CBC: Recent Labs  Lab 07/22/21 0432 07/23/21 0334 07/25/21 1451  WBC 10.5 23.1* 14.0*  HGB 12.0* 13.6 11.6*  HCT 38.6* 43.4 37.9*  MCV 90.0 88.9 90.0  PLT 88* 95* 62*    Cardiac Enzymes: No  results for input(s): CKTOTAL, CKMB, CKMBINDEX, TROPONINI in the last 168 hours.  BNP (last 3 results) No results for input(s): BNP in the last 8760 hours.  ProBNP (last 3 results) No results for input(s): PROBNP in the last 8760 hours.  Radiological Exams: DG Chest Port 1 View  Result Date: 07/26/2021 CLINICAL DATA:  Pneumonia. EXAM: PORTABLE CHEST 1 VIEW COMPARISON:  Chest x-ray dated July 23, 2021. FINDINGS: Unchanged tracheostomy tube. Normal heart size. Slightly increased pneumomediastinum. New trace bilateral apical pneumothoraces, not clearly seen on the prior study. Resolved airspace disease in the right lung. No pleural effusion. No acute osseous abnormality. IMPRESSION: 1. Slightly increased pneumomediastinum with new trace bilateral apical pneumothoraces. 2. Resolved right lung pneumonia. Electronically Signed   By: July 25, 2021 M.D.   On: 07/26/2021 06:18    Assessment/Plan Active Problems:   Acute on chronic respiratory failure with hypoxia (HCC)   Transverse myelitis (HCC)   Severe anxiety   DVT of axillary vein, acute (HCC)   Acute on chronic respiratory failure hypoxia plan is to try to start back on the weaning protocol continue secretion management pulmonary toilet. Transverse myelitis no change supportive care Severe anxiety patient is at baseline we will continue to follow closely. DVT treated Tracheostomy remains in place   I have personally seen and evaluated the patient, evaluated laboratory and imaging results, formulated the assessment and plan and placed orders. The Patient requires high complexity  decision making with multiple systems involvement.  Rounds were done with the Respiratory Therapy Director and Staff therapists and discussed with nursing staff also.  Allyne Gee, MD [facility] Pulmonary Critical Care Medicine Sleep Medicine

## 2021-07-27 ENCOUNTER — Other Ambulatory Visit (HOSPITAL_COMMUNITY): Payer: Medicare Other

## 2021-07-27 DIAGNOSIS — G373 Acute transverse myelitis in demyelinating disease of central nervous system: Secondary | ICD-10-CM | POA: Diagnosis not present

## 2021-07-27 DIAGNOSIS — I82A19 Acute embolism and thrombosis of unspecified axillary vein: Secondary | ICD-10-CM | POA: Diagnosis not present

## 2021-07-27 DIAGNOSIS — F419 Anxiety disorder, unspecified: Secondary | ICD-10-CM | POA: Diagnosis not present

## 2021-07-27 DIAGNOSIS — J9621 Acute and chronic respiratory failure with hypoxia: Secondary | ICD-10-CM | POA: Diagnosis not present

## 2021-07-27 LAB — CBC
HCT: 34.7 % — ABNORMAL LOW (ref 39.0–52.0)
Hemoglobin: 10.7 g/dL — ABNORMAL LOW (ref 13.0–17.0)
MCH: 28 pg (ref 26.0–34.0)
MCHC: 30.8 g/dL (ref 30.0–36.0)
MCV: 90.8 fL (ref 80.0–100.0)
Platelets: 50 10*3/uL — ABNORMAL LOW (ref 150–400)
RBC: 3.82 MIL/uL — ABNORMAL LOW (ref 4.22–5.81)
RDW: 20.1 % — ABNORMAL HIGH (ref 11.5–15.5)
WBC: 9.7 10*3/uL (ref 4.0–10.5)
nRBC: 0 % (ref 0.0–0.2)

## 2021-07-27 LAB — VANCOMYCIN, TROUGH: Vancomycin Tr: 8 ug/mL — ABNORMAL LOW (ref 15–20)

## 2021-07-27 LAB — BASIC METABOLIC PANEL
Anion gap: 5 (ref 5–15)
BUN: 30 mg/dL — ABNORMAL HIGH (ref 6–20)
CO2: 26 mmol/L (ref 22–32)
Calcium: 8.6 mg/dL — ABNORMAL LOW (ref 8.9–10.3)
Chloride: 108 mmol/L (ref 98–111)
Creatinine, Ser: 0.33 mg/dL — ABNORMAL LOW (ref 0.61–1.24)
GFR, Estimated: >60 mL/min (ref >60)
Glucose, Bld: 220 mg/dL — ABNORMAL HIGH (ref 70–99)
Potassium: 4.6 mmol/L (ref 3.5–5.1)
Sodium: 139 mmol/L (ref 135–145)

## 2021-07-27 LAB — MAGNESIUM
Magnesium: 1.7 mg/dL (ref 1.7–2.4)
Magnesium: 2.1 mg/dL (ref 1.7–2.4)

## 2021-07-27 LAB — PROTIME-INR
INR: 1.6 — ABNORMAL HIGH (ref 0.8–1.2)
Prothrombin Time: 19.3 seconds — ABNORMAL HIGH (ref 11.4–15.2)

## 2021-07-27 NOTE — Progress Notes (Signed)
Pulmonary Critical Care Medicine [facility] [place]   PULMONARY CRITICAL CARE SERVICE  PROGRESS NOTE     Dominic Benjamin  YPP:[id number]  DOB: 01-25-1966   DOA: 07/01/2021  Referring Physician: Luna Kitchens, MD  HPI: Dominic Benjamin is a 55 y.o. male being followed for ventilator/airway/oxygen weaning Acute on Chronic Respiratory Failure.  Patient is afebrile right now comfortable without distress has been on pressure support 45% FiO2  Medications: Reviewed on Rounds  Physical Exam:  Vitals: Temperature is 96.4 pulse 50 respiratory 24 blood pressure is 153/68 saturations 100%  Ventilator Settings on pressure support pressure 12/5  General: Comfortable at this time Neck: supple Cardiovascular: no malignant arrhythmias Respiratory: No rhonchi very coarse breath sounds Skin: no rash seen on limited exam Musculoskeletal: No gross abnormality Psychiatric:unable to assess Neurologic:no involuntary movements         Lab Data:   Basic Metabolic Panel: Recent Labs  Lab 07/22/21 1639 07/23/21 0334 07/25/21 1451 07/27/21 0300  NA 138 139 140 139  K 5.0 4.5 4.2 4.6  CL 106 106 106 108  CO2 26 26 26 26   GLUCOSE 155* 167* 215* 220*  BUN 29* 36* 44* 30*  CREATININE <0.30* 0.35* 0.33* 0.33*  CALCIUM 8.9 9.0 9.0 8.6*  MG  --   --   --  1.7    ABG: Recent Labs  Lab 07/22/21 2320 07/23/21 0932  PHART 7.504* 7.389  PCO2ART 33.0 45.4  PO2ART 35.2* 75.2*  HCO3 26.0 26.8  O2SAT 72.3 94.9    Liver Function Tests: Recent Labs  Lab 07/23/21 0334  AST 39  ALT 54*  ALKPHOS 128*  BILITOT 0.9  PROT 5.4*  ALBUMIN 3.0*   No results for input(s): LIPASE, AMYLASE in the last 168 hours. No results for input(s): AMMONIA in the last 168 hours.  CBC: Recent Labs  Lab 07/22/21 0432 07/23/21 0334 07/25/21 1451 07/27/21 0300  WBC 10.5 23.1* 14.0* 9.7  HGB 12.0* 13.6 11.6* 10.7*  HCT 38.6* 43.4 37.9* 34.7*  MCV 90.0 88.9 90.0 90.8  PLT 88* 95*  62* 50*    Cardiac Enzymes: No results for input(s): CKTOTAL, CKMB, CKMBINDEX, TROPONINI in the last 168 hours.  BNP (last 3 results) No results for input(s): BNP in the last 8760 hours.  ProBNP (last 3 results) No results for input(s): PROBNP in the last 8760 hours.  Radiological Exams: DG Chest Port 1 View  Result Date: 07/27/2021 CLINICAL DATA:  55 year old male with mild pneumothorax and pneumomediastinum. Respiratory failure, sepsis, transverse myelitis. EXAM: PORTABLE CHEST 1 VIEW COMPARISON:  Portable chest 07/26/2021 and earlier. FINDINGS: Portable AP semi upright view at 0604 hours. The patient is now rotated to the right. Tracheostomy and right PICC line are stable. Stable cardiac size and mediastinal contours. Stable lung volumes. Small volume right chest wall subcutaneous gas is stable. Streaky left supraclavicular subcutaneous gas also appears stable. Small volume pneumomediastinum appears stable. Trace apical pneumothoraces were better demonstrated yesterday but stable. No new pulmonary opacity.  Stable visualized osseous structures. IMPRESSION: 1.  Stable lines and tubes.  Stable ventilation. 2. Stable trace bilateral pneumothoraces, small volume pneumomediastinum, and small volume bilateral chest wall and neck subcutaneous emphysema. Electronically Signed   By: 09/25/2021 M.D.   On: 07/27/2021 06:29   DG CHEST PORT 1 VIEW  Result Date: 07/26/2021 CLINICAL DATA:  PICC placement. EXAM: PORTABLE CHEST 1 VIEW COMPARISON:  Same day. FINDINGS: Stable cardiomediastinal silhouette. Tracheostomy tube is unchanged in position. Interval placement of right-sided  PICC line with distal tip in expected position of the SVC. Stable bilateral minimal apical pneumothoraces. Stable pneumomediastinum is noted. Subcutaneous emphysema is seen over right lateral chest wall as well as in both supraclavicular regions. Bony thorax is unremarkable. IMPRESSION: Interval placement of right-sided PICC line with  distal tip in expected position of the SVC. Stable minimal bilateral apical pneumothoraces and pneumomediastinum is noted. Electronically Signed   By: Lupita Raider M.D.   On: 07/26/2021 14:51   DG Chest Port 1 View  Result Date: 07/26/2021 CLINICAL DATA:  Pneumonia. EXAM: PORTABLE CHEST 1 VIEW COMPARISON:  Chest x-ray dated July 23, 2021. FINDINGS: Unchanged tracheostomy tube. Normal heart size. Slightly increased pneumomediastinum. New trace bilateral apical pneumothoraces, not clearly seen on the prior study. Resolved airspace disease in the right lung. No pleural effusion. No acute osseous abnormality. IMPRESSION: 1. Slightly increased pneumomediastinum with new trace bilateral apical pneumothoraces. 2. Resolved right lung pneumonia. Electronically Signed   By: Obie Dredge M.D.   On: 07/26/2021 06:18    Assessment/Plan Active Problems:   Acute on chronic respiratory failure with hypoxia (HCC)   Transverse myelitis (HCC)   Severe anxiety   DVT of axillary vein, acute (HCC)   Acute on chronic respiratory failure with hypoxia continue to wean on pressure support as tolerated.  Secretions are fair to moderate we will continue to monitor and follow along Transverse myelitis with quadriplegia no change supportive care Severe anxiety at baseline DVT treated we will continue to follow Tracheostomy remains in place   I have personally seen and evaluated the patient, evaluated laboratory and imaging results, formulated the assessment and plan and placed orders. The Patient requires high complexity decision making with multiple systems involvement.  Rounds were done with the Respiratory Therapy Director and Staff therapists and discussed with nursing staff also.  Yevonne Pax, MD [facility] Pulmonary Critical Care Medicine Sleep Medicine

## 2021-07-28 DIAGNOSIS — F419 Anxiety disorder, unspecified: Secondary | ICD-10-CM | POA: Diagnosis not present

## 2021-07-28 DIAGNOSIS — J9621 Acute and chronic respiratory failure with hypoxia: Secondary | ICD-10-CM | POA: Diagnosis not present

## 2021-07-28 DIAGNOSIS — G373 Acute transverse myelitis in demyelinating disease of central nervous system: Secondary | ICD-10-CM | POA: Diagnosis not present

## 2021-07-28 DIAGNOSIS — I82A19 Acute embolism and thrombosis of unspecified axillary vein: Secondary | ICD-10-CM | POA: Diagnosis not present

## 2021-07-28 LAB — BASIC METABOLIC PANEL
Anion gap: 6 (ref 5–15)
BUN: 21 mg/dL — ABNORMAL HIGH (ref 6–20)
CO2: 25 mmol/L (ref 22–32)
Calcium: 8.3 mg/dL — ABNORMAL LOW (ref 8.9–10.3)
Chloride: 104 mmol/L (ref 98–111)
Creatinine, Ser: <0.3 mg/dL — ABNORMAL LOW (ref 0.61–1.24)
Glucose, Bld: 221 mg/dL — ABNORMAL HIGH (ref 70–99)
Potassium: 4.6 mmol/L (ref 3.5–5.1)
Sodium: 135 mmol/L (ref 135–145)

## 2021-07-28 LAB — PROTIME-INR
INR: 2 — ABNORMAL HIGH (ref 0.8–1.2)
Prothrombin Time: 22.3 seconds — ABNORMAL HIGH (ref 11.4–15.2)

## 2021-07-28 LAB — CULTURE, BLOOD (ROUTINE X 2)
Culture: NO GROWTH
Culture: NO GROWTH
Special Requests: ADEQUATE
Special Requests: ADEQUATE

## 2021-07-28 LAB — MAGNESIUM: Magnesium: 1.9 mg/dL (ref 1.7–2.4)

## 2021-07-28 NOTE — Progress Notes (Signed)
Pulmonary Critical Care Medicine [facility] [place]   PULMONARY CRITICAL CARE SERVICE  PROGRESS NOTE     Dominic Benjamin  BMW:[id number]  DOB: 02/23/1966   DOA: 07/01/2021  Referring Physician: Luna Kitchens, MD  HPI: Dominic Benjamin is a 55 y.o. male being followed for ventilator/airway/oxygen weaning Acute on Chronic Respiratory Failure.  Patient is resting comfortably now without distress Has been on pressure support supposed to do 4 hours of weaning  Medications: Reviewed on Rounds  Physical Exam:  Vitals: Temperature is 96.8 pulse of 47 respiratory rate 23 blood pressure is 152/65 saturations 96%  Ventilator Settings on pressure support 12/5 FiO2 is 45%  General: Comfortable at this time Neck: supple Cardiovascular: no malignant arrhythmias Respiratory: No rhonchi very coarse breath sounds Skin: no rash seen on limited exam Musculoskeletal: No gross abnormality Psychiatric:unable to assess Neurologic:no involuntary movements         Lab Data:   Basic Metabolic Panel: Recent Labs  Lab 07/22/21 1639 07/23/21 0334 07/25/21 1451 07/27/21 0300 07/27/21 2209 07/28/21 0413  NA 138 139 140 139  --   --   K 5.0 4.5 4.2 4.6  --   --   CL 106 106 106 108  --   --   CO2 26 26 26 26   --   --   GLUCOSE 155* 167* 215* 220*  --   --   BUN 29* 36* 44* 30*  --   --   CREATININE <0.30* 0.35* 0.33* 0.33*  --   --   CALCIUM 8.9 9.0 9.0 8.6*  --   --   MG  --   --   --  1.7 2.1 1.9    ABG: Recent Labs  Lab 07/22/21 2320 07/23/21 0932  PHART 7.504* 7.389  PCO2ART 33.0 45.4  PO2ART 35.2* 75.2*  HCO3 26.0 26.8  O2SAT 72.3 94.9    Liver Function Tests: Recent Labs  Lab 07/23/21 0334  AST 39  ALT 54*  ALKPHOS 128*  BILITOT 0.9  PROT 5.4*  ALBUMIN 3.0*   No results for input(s): LIPASE, AMYLASE in the last 168 hours. No results for input(s): AMMONIA in the last 168 hours.  CBC: Recent Labs  Lab 07/22/21 0432 07/23/21 0334  07/25/21 1451 07/27/21 0300  WBC 10.5 23.1* 14.0* 9.7  HGB 12.0* 13.6 11.6* 10.7*  HCT 38.6* 43.4 37.9* 34.7*  MCV 90.0 88.9 90.0 90.8  PLT 88* 95* 62* 50*    Cardiac Enzymes: No results for input(s): CKTOTAL, CKMB, CKMBINDEX, TROPONINI in the last 168 hours.  BNP (last 3 results) No results for input(s): BNP in the last 8760 hours.  ProBNP (last 3 results) No results for input(s): PROBNP in the last 8760 hours.  Radiological Exams: DG Chest Port 1 View  Result Date: 07/27/2021 CLINICAL DATA:  55 year old male with mild pneumothorax and pneumomediastinum. Respiratory failure, sepsis, transverse myelitis. EXAM: PORTABLE CHEST 1 VIEW COMPARISON:  Portable chest 07/26/2021 and earlier. FINDINGS: Portable AP semi upright view at 0604 hours. The patient is now rotated to the right. Tracheostomy and right PICC line are stable. Stable cardiac size and mediastinal contours. Stable lung volumes. Small volume right chest wall subcutaneous gas is stable. Streaky left supraclavicular subcutaneous gas also appears stable. Small volume pneumomediastinum appears stable. Trace apical pneumothoraces were better demonstrated yesterday but stable. No new pulmonary opacity.  Stable visualized osseous structures. IMPRESSION: 1.  Stable lines and tubes.  Stable ventilation. 2. Stable trace bilateral pneumothoraces, small volume pneumomediastinum, and  small volume bilateral chest wall and neck subcutaneous emphysema. Electronically Signed   By: Odessa Fleming M.D.   On: 07/27/2021 06:29   DG CHEST PORT 1 VIEW  Result Date: 07/26/2021 CLINICAL DATA:  PICC placement. EXAM: PORTABLE CHEST 1 VIEW COMPARISON:  Same day. FINDINGS: Stable cardiomediastinal silhouette. Tracheostomy tube is unchanged in position. Interval placement of right-sided PICC line with distal tip in expected position of the SVC. Stable bilateral minimal apical pneumothoraces. Stable pneumomediastinum is noted. Subcutaneous emphysema is seen over right  lateral chest wall as well as in both supraclavicular regions. Bony thorax is unremarkable. IMPRESSION: Interval placement of right-sided PICC line with distal tip in expected position of the SVC. Stable minimal bilateral apical pneumothoraces and pneumomediastinum is noted. Electronically Signed   By: Lupita Raider M.D.   On: 07/26/2021 14:51    Assessment/Plan Active Problems:   Acute on chronic respiratory failure with hypoxia (HCC)   Transverse myelitis (HCC)   Severe anxiety   DVT of axillary vein, acute (HCC)   Acute on chronic respiratory failure with hypoxia plan is going to be to continue with wean protocol supposed to do 4 hours of pressure support today.  Try to advance as tolerated we will continue with pulmonary toilet and supportive care. Transverse myelitis progressive disease we will continue to monitor closely. Severe anxiety under better control still has bouts of shouting etc. DVT treated we will continue to follow along closely. Tracheostomy will remain in place   I have personally seen and evaluated the patient, evaluated laboratory and imaging results, formulated the assessment and plan and placed orders. The Patient requires high complexity decision making with multiple systems involvement.  Rounds were done with the Respiratory Therapy Director and Staff therapists and discussed with nursing staff also.  Yevonne Pax, MD [facility] Pulmonary Critical Care Medicine Sleep Medicine

## 2021-07-29 DIAGNOSIS — J9621 Acute and chronic respiratory failure with hypoxia: Secondary | ICD-10-CM | POA: Diagnosis not present

## 2021-07-29 DIAGNOSIS — I82A19 Acute embolism and thrombosis of unspecified axillary vein: Secondary | ICD-10-CM | POA: Diagnosis not present

## 2021-07-29 DIAGNOSIS — F419 Anxiety disorder, unspecified: Secondary | ICD-10-CM | POA: Diagnosis not present

## 2021-07-29 DIAGNOSIS — G373 Acute transverse myelitis in demyelinating disease of central nervous system: Secondary | ICD-10-CM | POA: Diagnosis not present

## 2021-07-29 LAB — PROTIME-INR
INR: 2.6 — ABNORMAL HIGH (ref 0.8–1.2)
Prothrombin Time: 28.1 seconds — ABNORMAL HIGH (ref 11.4–15.2)

## 2021-07-29 LAB — VANCOMYCIN, TROUGH: Vancomycin Tr: 11 ug/mL — ABNORMAL LOW (ref 15–20)

## 2021-07-29 NOTE — Progress Notes (Signed)
Pulmonary Critical Care Medicine [facility] [place]   PULMONARY CRITICAL CARE SERVICE  PROGRESS NOTE     Dominic Benjamin  GEX:[id number]  DOB: [birth year]   DOA: 07/01/2021  Referring Physician: Luna Kitchens, MD  HPI: Dominic Benjamin is a 55 y.o. male being followed for ventilator/airway/oxygen weaning Acute on Chronic Respiratory Failure.  Patient currently is on pressure support has been on a pressure of 12/5  Medications: Reviewed on Rounds  Physical Exam:  Vitals: Temperature 96.1 pulse 54 respiratory 21 blood pressure is 158/90 saturations 100%  Ventilator Settings pressure support FiO2 this 40% pressure 12/5  General: Comfortable at this time Neck: supple Cardiovascular: no malignant arrhythmias Respiratory: No rhonchi very coarse breath sounds Skin: no rash seen on limited exam Musculoskeletal: No gross abnormality Psychiatric:unable to assess Neurologic:no involuntary movements         Lab Data:   Basic Metabolic Panel: Recent Labs  Lab 07/22/21 1639 07/23/21 0334 07/25/21 1451 07/27/21 0300 07/27/21 2209 07/28/21 0413  NA 138 139 140 139  --  135  K 5.0 4.5 4.2 4.6  --  4.6  CL 106 106 106 108  --  104  CO2 26 26 26 26   --  25  GLUCOSE 155* 167* 215* 220*  --  221*  BUN 29* 36* 44* 30*  --  21*  CREATININE <0.30* 0.35* 0.33* 0.33*  --  <0.30*  CALCIUM 8.9 9.0 9.0 8.6*  --  8.3*  MG  --   --   --  1.7 2.1 1.9    ABG: Recent Labs  Lab 07/22/21 2320 07/23/21 0932  PHART 7.504* 7.389  PCO2ART 33.0 45.4  PO2ART 35.2* 75.2*  HCO3 26.0 26.8  O2SAT 72.3 94.9    Liver Function Tests: Recent Labs  Lab 07/23/21 0334  AST 39  ALT 54*  ALKPHOS 128*  BILITOT 0.9  PROT 5.4*  ALBUMIN 3.0*   No results for input(s): LIPASE, AMYLASE in the last 168 hours. No results for input(s): AMMONIA in the last 168 hours.  CBC: Recent Labs  Lab 07/23/21 0334 07/25/21 1451 07/27/21 0300  WBC 23.1* 14.0* 9.7  HGB 13.6 11.6* 10.7*   HCT 43.4 37.9* 34.7*  MCV 88.9 90.0 90.8  PLT 95* 62* 50*    Cardiac Enzymes: No results for input(s): CKTOTAL, CKMB, CKMBINDEX, TROPONINI in the last 168 hours.  BNP (last 3 results) No results for input(s): BNP in the last 8760 hours.  ProBNP (last 3 results) No results for input(s): PROBNP in the last 8760 hours.  Radiological Exams: No results found.  Assessment/Plan Active Problems:   Acute on chronic respiratory failure with hypoxia (HCC)   Transverse myelitis (HCC)   Severe anxiety   DVT of axillary vein, acute (HCC)   Acute on chronic respiratory failure hypoxia plan is to continue with the weaning protocol resting mode assist control with tidal volume 450 Transverse myelitis no change continue with supportive care Severe anxiety patient is at baseline DVT treated Tracheostomy remains in place   I have personally seen and evaluated the patient, evaluated laboratory and imaging results, formulated the assessment and plan and placed orders. The Patient requires high complexity decision making with multiple systems involvement.  Rounds were done with the Respiratory Therapy Director and Staff therapists and discussed with nursing staff also.  09/26/21, MD [facility] Pulmonary Critical Care Medicine Sleep Medicine

## 2021-07-30 DIAGNOSIS — F419 Anxiety disorder, unspecified: Secondary | ICD-10-CM | POA: Diagnosis not present

## 2021-07-30 DIAGNOSIS — J9621 Acute and chronic respiratory failure with hypoxia: Secondary | ICD-10-CM | POA: Diagnosis not present

## 2021-07-30 DIAGNOSIS — I82A19 Acute embolism and thrombosis of unspecified axillary vein: Secondary | ICD-10-CM | POA: Diagnosis not present

## 2021-07-30 DIAGNOSIS — G373 Acute transverse myelitis in demyelinating disease of central nervous system: Secondary | ICD-10-CM | POA: Diagnosis not present

## 2021-07-30 LAB — PROTIME-INR
INR: 2.5 — ABNORMAL HIGH (ref 0.8–1.2)
Prothrombin Time: 27.3 seconds — ABNORMAL HIGH (ref 11.4–15.2)

## 2021-07-30 NOTE — Progress Notes (Signed)
Pulmonary Critical Care Medicine [facility] [place]   PULMONARY CRITICAL CARE SERVICE  PROGRESS NOTE     Sladen Plancarte  IRC:[id number]  DOB: 11-Sep-1966   DOA: 07/01/2021  Referring Physician: Luna Kitchens, MD  HPI: Nickalous Stingley is a 55 y.o. male being followed for ventilator/airway/oxygen weaning Acute on Chronic Respiratory Failure.  Apparently had bradycardia doing little bit better now continues to be on telemetry  Medications: Reviewed on Rounds  Physical Exam:  Vitals: Temperature is 98.2 pulse 53 respiratory rate is 21 blood pressure is 164/89 saturations 99%  Ventilator Settings currently is on assist control mode  General: Comfortable at this time Neck: supple Cardiovascular: no malignant arrhythmias Respiratory: No rhonchi no rales are noted at this time. Skin: no rash seen on limited exam Musculoskeletal: No gross abnormality Psychiatric:unable to assess Neurologic:no involuntary movements         Lab Data:   Basic Metabolic Panel: Recent Labs  Lab 07/25/21 1451 07/27/21 0300 07/27/21 2209 07/28/21 0413  NA 140 139  --  135  K 4.2 4.6  --  4.6  CL 106 108  --  104  CO2 26 26  --  25  GLUCOSE 215* 220*  --  221*  BUN 44* 30*  --  21*  CREATININE 0.33* 0.33*  --  <0.30*  CALCIUM 9.0 8.6*  --  8.3*  MG  --  1.7 2.1 1.9    ABG: No results for input(s): PHART, PCO2ART, PO2ART, HCO3, O2SAT in the last 168 hours.  Liver Function Tests: No results for input(s): AST, ALT, ALKPHOS, BILITOT, PROT, ALBUMIN in the last 168 hours. No results for input(s): LIPASE, AMYLASE in the last 168 hours. No results for input(s): AMMONIA in the last 168 hours.  CBC: Recent Labs  Lab 07/25/21 1451 07/27/21 0300  WBC 14.0* 9.7  HGB 11.6* 10.7*  HCT 37.9* 34.7*  MCV 90.0 90.8  PLT 62* 50*    Cardiac Enzymes: No results for input(s): CKTOTAL, CKMB, CKMBINDEX, TROPONINI in the last 168 hours.  BNP (last 3 results) No results for  input(s): BNP in the last 8760 hours.  ProBNP (last 3 results) No results for input(s): PROBNP in the last 8760 hours.  Radiological Exams: No results found.  Assessment/Plan Active Problems:   Acute on chronic respiratory failure with hypoxia (HCC)   Transverse myelitis (HCC)   Severe anxiety   DVT of axillary vein, acute (HCC)   Acute on chronic respiratory failure with hypoxia plan is going to be to continue with T-piece continuous pressure management supportive care. Transverse myelitis progressive disease we will continue to monitor Severe anxiety patient is at baseline DVT treated Tracheostomy remains in place   I have personally seen and evaluated the patient, evaluated laboratory and imaging results, formulated the assessment and plan and placed orders. The Patient requires high complexity decision making with multiple systems involvement.  Rounds were done with the Respiratory Therapy Director and Staff therapists and discussed with nursing staff also.  Yevonne Pax, MD [facility] Pulmonary Critical Care Medicine Sleep Medicine Apparently had an event of bradycardia now is better

## 2021-07-31 DIAGNOSIS — F419 Anxiety disorder, unspecified: Secondary | ICD-10-CM | POA: Diagnosis not present

## 2021-07-31 DIAGNOSIS — I82A19 Acute embolism and thrombosis of unspecified axillary vein: Secondary | ICD-10-CM | POA: Diagnosis not present

## 2021-07-31 DIAGNOSIS — G373 Acute transverse myelitis in demyelinating disease of central nervous system: Secondary | ICD-10-CM | POA: Diagnosis not present

## 2021-07-31 DIAGNOSIS — J9621 Acute and chronic respiratory failure with hypoxia: Secondary | ICD-10-CM | POA: Diagnosis not present

## 2021-07-31 LAB — PROTIME-INR
INR: 2.1 — ABNORMAL HIGH (ref 0.8–1.2)
Prothrombin Time: 23.1 seconds — ABNORMAL HIGH (ref 11.4–15.2)

## 2021-07-31 NOTE — Progress Notes (Signed)
Pulmonary Critical Care Medicine [facility] [place]   PULMONARY CRITICAL CARE SERVICE  PROGRESS NOTE     Carlos Heber  VPX:[id number]  DOB: 21-Feb-1966   DOA: 07/01/2021  Referring Physician: Luna Kitchens, MD  HPI: Dominic Benjamin is a 55 y.o. male being followed for ventilator/airway/oxygen weaning Acute on Chronic Respiratory Failure.  Patient is currently on pressure support mode with a goal of 8 hours  Medications: Reviewed on Rounds  Physical Exam:  Vitals: Temperature 98.0 pulse of 62 respiratory rate 20 blood pressure 103/63 saturations 100%  Ventilator Settings on pressure support FiO2 is 40% pressure 12/5  General: Comfortable at this time Neck: supple Cardiovascular: no malignant arrhythmias Respiratory: Coarse breath sounds with a few scattered rhonchi Skin: no rash seen on limited exam Musculoskeletal: No gross abnormality Psychiatric:unable to assess Neurologic:no involuntary movements         Lab Data:   Basic Metabolic Panel: Recent Labs  Lab 07/25/21 1451 07/27/21 0300 07/27/21 2209 07/28/21 0413  NA 140 139  --  135  K 4.2 4.6  --  4.6  CL 106 108  --  104  CO2 26 26  --  25  GLUCOSE 215* 220*  --  221*  BUN 44* 30*  --  21*  CREATININE 0.33* 0.33*  --  <0.30*  CALCIUM 9.0 8.6*  --  8.3*  MG  --  1.7 2.1 1.9    ABG: No results for input(s): PHART, PCO2ART, PO2ART, HCO3, O2SAT in the last 168 hours.  Liver Function Tests: No results for input(s): AST, ALT, ALKPHOS, BILITOT, PROT, ALBUMIN in the last 168 hours. No results for input(s): LIPASE, AMYLASE in the last 168 hours. No results for input(s): AMMONIA in the last 168 hours.  CBC: Recent Labs  Lab 07/25/21 1451 07/27/21 0300  WBC 14.0* 9.7  HGB 11.6* 10.7*  HCT 37.9* 34.7*  MCV 90.0 90.8  PLT 62* 50*    Cardiac Enzymes: No results for input(s): CKTOTAL, CKMB, CKMBINDEX, TROPONINI in the last 168 hours.  BNP (last 3 results) No results for  input(s): BNP in the last 8760 hours.  ProBNP (last 3 results) No results for input(s): PROBNP in the last 8760 hours.  Radiological Exams: No results found.  Assessment/Plan Active Problems:   Acute on chronic respiratory failure with hypoxia (HCC)   Transverse myelitis (HCC)   Severe anxiety   DVT of axillary vein, acute (HCC)   Acute on chronic respiratory failure with hypoxia we will continue with the pressure support the goal of weaning is 8 hours today Transverse myelitis no change we will continue to monitor closely Severe anxiety patient is at baseline DVT treated Tracheostomy remains in place   I have personally seen and evaluated the patient, evaluated laboratory and imaging results, formulated the assessment and plan and placed orders. The Patient requires high complexity decision making with multiple systems involvement.  Rounds were done with the Respiratory Therapy Director and Staff therapists and discussed with nursing staff also.  Yevonne Pax, MD [facility] Pulmonary Critical Care Medicine Sleep Medicine

## 2021-08-01 DIAGNOSIS — G373 Acute transverse myelitis in demyelinating disease of central nervous system: Secondary | ICD-10-CM | POA: Diagnosis not present

## 2021-08-01 DIAGNOSIS — J9621 Acute and chronic respiratory failure with hypoxia: Secondary | ICD-10-CM | POA: Diagnosis not present

## 2021-08-01 DIAGNOSIS — F419 Anxiety disorder, unspecified: Secondary | ICD-10-CM | POA: Diagnosis not present

## 2021-08-01 DIAGNOSIS — I82A19 Acute embolism and thrombosis of unspecified axillary vein: Secondary | ICD-10-CM | POA: Diagnosis not present

## 2021-08-01 NOTE — Progress Notes (Signed)
Pulmonary Critical Care Medicine [facility] [place]   PULMONARY CRITICAL CARE SERVICE  PROGRESS NOTE     Dominic Benjamin  DGL:[id number]  DOB: Nov 03, 1966   DOA: 07/01/2021  Referring Physician: Luna Kitchens, MD  HPI: Dominic Benjamin is a 55 y.o. male being followed for ventilator/airway/oxygen weaning Acute on Chronic Respiratory Failure.  Patient is on pressure support has been on a pressure of 12/5 seems to be tolerating it well.  Medications: Reviewed on Rounds  Physical Exam:  Vitals: Temperature is 96.7 pulse 70 respiratory rate is 22 blood pressure is 127/72 saturations 98%  Ventilator Settings on pressure support pressure 12 5  General: Comfortable at this time Neck: supple Cardiovascular: no malignant arrhythmias Respiratory: No rhonchi no rales Skin: no rash seen on limited exam Musculoskeletal: No gross abnormality Psychiatric:unable to assess Neurologic:no involuntary movements         Lab Data:   Basic Metabolic Panel: Recent Labs  Lab 07/25/21 1451 07/27/21 0300 07/27/21 2209 07/28/21 0413  NA 140 139  --  135  K 4.2 4.6  --  4.6  CL 106 108  --  104  CO2 26 26  --  25  GLUCOSE 215* 220*  --  221*  BUN 44* 30*  --  21*  CREATININE 0.33* 0.33*  --  <0.30*  CALCIUM 9.0 8.6*  --  8.3*  MG  --  1.7 2.1 1.9    ABG: No results for input(s): PHART, PCO2ART, PO2ART, HCO3, O2SAT in the last 168 hours.  Liver Function Tests: No results for input(s): AST, ALT, ALKPHOS, BILITOT, PROT, ALBUMIN in the last 168 hours. No results for input(s): LIPASE, AMYLASE in the last 168 hours. No results for input(s): AMMONIA in the last 168 hours.  CBC: Recent Labs  Lab 07/25/21 1451 07/27/21 0300  WBC 14.0* 9.7  HGB 11.6* 10.7*  HCT 37.9* 34.7*  MCV 90.0 90.8  PLT 62* 50*    Cardiac Enzymes: No results for input(s): CKTOTAL, CKMB, CKMBINDEX, TROPONINI in the last 168 hours.  BNP (last 3 results) No results for input(s): BNP in the  last 8760 hours.  ProBNP (last 3 results) No results for input(s): PROBNP in the last 8760 hours.  Radiological Exams: No results found.  Assessment/Plan Active Problems:   Acute on chronic respiratory failure with hypoxia (HCC)   Transverse myelitis (HCC)   Severe anxiety   DVT of axillary vein, acute (HCC)   Acute on chronic respiratory failure hypoxia we will continue to try to wean on pressure support patient supposed to do 12-hour goal Transverse myelitis patient is at baseline right now continue with supportive care Severe anxiety at baseline DVT treated Tracheostomy remains in place   I have personally seen and evaluated the patient, evaluated laboratory and imaging results, formulated the assessment and plan and placed orders. The Patient requires high complexity decision making with multiple systems involvement.  Rounds were done with the Respiratory Therapy Director and Staff therapists and discussed with nursing staff also.  Yevonne Pax, MD [facility] Pulmonary Critical Care Medicine Sleep Medicine

## 2021-08-02 DIAGNOSIS — J9621 Acute and chronic respiratory failure with hypoxia: Secondary | ICD-10-CM | POA: Diagnosis not present

## 2021-08-02 DIAGNOSIS — F419 Anxiety disorder, unspecified: Secondary | ICD-10-CM | POA: Diagnosis not present

## 2021-08-02 DIAGNOSIS — I82A19 Acute embolism and thrombosis of unspecified axillary vein: Secondary | ICD-10-CM | POA: Diagnosis not present

## 2021-08-02 DIAGNOSIS — G373 Acute transverse myelitis in demyelinating disease of central nervous system: Secondary | ICD-10-CM | POA: Diagnosis not present

## 2021-08-02 LAB — PROTIME-INR
INR: 1.8 — ABNORMAL HIGH (ref 0.8–1.2)
Prothrombin Time: 21.1 seconds — ABNORMAL HIGH (ref 11.4–15.2)

## 2021-08-02 NOTE — Progress Notes (Signed)
Pulmonary Critical Care Medicine [facility] [place]   PULMONARY CRITICAL CARE SERVICE  PROGRESS NOTE     Durwood Dittus  VPX:[id number]  DOB: Nov 26, 1965   DOA: 07/01/2021  Referring Physician: Luna Kitchens, MD  HPI: Kinte Trim is a 55 y.o. male being followed for ventilator/airway/oxygen weaning Acute on Chronic Respiratory Failure.  Patient is comfortable right now without distress has been on pressure support FiO2 of 40%  Medications: Reviewed on Rounds  Physical Exam:  Vitals: Temperature is 97 pulse of 80 respiratory rate is 18 blood pressure is 113/69 saturations 99%  Ventilator Settings on pressure support FiO2 is 40% pressure of 12/5  General: Comfortable at this time Neck: supple Cardiovascular: no malignant arrhythmias Respiratory: Scattered rhonchi expansion is equal Skin: no rash seen on limited exam Musculoskeletal: No gross abnormality Psychiatric:unable to assess Neurologic:no involuntary movements         Lab Data:   Basic Metabolic Panel: Recent Labs  Lab 07/27/21 0300 07/27/21 2209 07/28/21 0413  NA 139  --  135  K 4.6  --  4.6  CL 108  --  104  CO2 26  --  25  GLUCOSE 220*  --  221*  BUN 30*  --  21*  CREATININE 0.33*  --  <0.30*  CALCIUM 8.6*  --  8.3*  MG 1.7 2.1 1.9    ABG: No results for input(s): PHART, PCO2ART, PO2ART, HCO3, O2SAT in the last 168 hours.  Liver Function Tests: No results for input(s): AST, ALT, ALKPHOS, BILITOT, PROT, ALBUMIN in the last 168 hours. No results for input(s): LIPASE, AMYLASE in the last 168 hours. No results for input(s): AMMONIA in the last 168 hours.  CBC: Recent Labs  Lab 07/27/21 0300  WBC 9.7  HGB 10.7*  HCT 34.7*  MCV 90.8  PLT 50*    Cardiac Enzymes: No results for input(s): CKTOTAL, CKMB, CKMBINDEX, TROPONINI in the last 168 hours.  BNP (last 3 results) No results for input(s): BNP in the last 8760 hours.  ProBNP (last 3 results) No results for  input(s): PROBNP in the last 8760 hours.  Radiological Exams: No results found.  Assessment/Plan Active Problems:   Acute on chronic respiratory failure with hypoxia (HCC)   Transverse myelitis (HCC)   Severe anxiety   DVT of axillary vein, acute (HCC)   Acute on chronic respiratory failure with hypoxia we will continue with pressure support mode titrate oxygen as tolerated continue pulmonary toilet. Transverse myelitis no change we will continue to follow along Severe anxiety patient is at baseline DVT treated Tracheostomy remains in place   I have personally seen and evaluated the patient, evaluated laboratory and imaging results, formulated the assessment and plan and placed orders. The Patient requires high complexity decision making with multiple systems involvement.  Rounds were done with the Respiratory Therapy Director and Staff therapists and discussed with nursing staff also.  Yevonne Pax, MD [facility] Pulmonary Critical Care Medicine Sleep Medicine

## 2021-08-03 DIAGNOSIS — I82A19 Acute embolism and thrombosis of unspecified axillary vein: Secondary | ICD-10-CM | POA: Diagnosis not present

## 2021-08-03 DIAGNOSIS — F419 Anxiety disorder, unspecified: Secondary | ICD-10-CM | POA: Diagnosis not present

## 2021-08-03 DIAGNOSIS — G373 Acute transverse myelitis in demyelinating disease of central nervous system: Secondary | ICD-10-CM | POA: Diagnosis not present

## 2021-08-03 DIAGNOSIS — J9621 Acute and chronic respiratory failure with hypoxia: Secondary | ICD-10-CM | POA: Diagnosis not present

## 2021-08-03 LAB — PROTIME-INR
INR: 2.1 — ABNORMAL HIGH (ref 0.8–1.2)
Prothrombin Time: 23.6 seconds — ABNORMAL HIGH (ref 11.4–15.2)

## 2021-08-03 NOTE — Progress Notes (Signed)
Pulmonary Critical Care Medicine [facility] [place]   PULMONARY CRITICAL CARE SERVICE  PROGRESS NOTE     Dominic Benjamin  QPR:[id number]  DOB: Apr 16, 1966   DOA: 07/01/2021  Referring Physician: Luna Kitchens, MD  HPI: Dominic Benjamin is a 55 y.o. male being followed for ventilator/airway/oxygen weaning Acute on Chronic Respiratory Failure.  Patient currently is on T collar has been on 40% FiO2 will be completing 24 hours  Medications: Reviewed on Rounds  Physical Exam:  Vitals: Temperature 97.5 pulse 85 respiratory is 22 blood pressure is 161/72 saturations 93%  Ventilator Settings on T collar FiO2 is 40%  General: Comfortable at this time Neck: supple Cardiovascular: no malignant arrhythmias Respiratory: Scattered rhonchi expansion is equal at this time Skin: no rash seen on limited exam Musculoskeletal: No gross abnormality Psychiatric:unable to assess Neurologic:no involuntary movements         Lab Data:   Basic Metabolic Panel: Recent Labs  Lab 07/27/21 2209 07/28/21 0413  NA  --  135  K  --  4.6  CL  --  104  CO2  --  25  GLUCOSE  --  221*  BUN  --  21*  CREATININE  --  <0.30*  CALCIUM  --  8.3*  MG 2.1 1.9    ABG: No results for input(s): PHART, PCO2ART, PO2ART, HCO3, O2SAT in the last 168 hours.  Liver Function Tests: No results for input(s): AST, ALT, ALKPHOS, BILITOT, PROT, ALBUMIN in the last 168 hours. No results for input(s): LIPASE, AMYLASE in the last 168 hours. No results for input(s): AMMONIA in the last 168 hours.  CBC: No results for input(s): WBC, NEUTROABS, HGB, HCT, MCV, PLT in the last 168 hours.  Cardiac Enzymes: No results for input(s): CKTOTAL, CKMB, CKMBINDEX, TROPONINI in the last 168 hours.  BNP (last 3 results) No results for input(s): BNP in the last 8760 hours.  ProBNP (last 3 results) No results for input(s): PROBNP in the last 8760 hours.  Radiological Exams: No results  found.  Assessment/Plan Active Problems:   Acute on chronic respiratory failure with hypoxia (HCC)   Transverse myelitis (HCC)   Severe anxiety   DVT of axillary vein, acute (HCC)   Acute on chronic respiratory failure hypoxia we will continue with the T collar patient will be completing 24-hour Transverse myelitis we will continue with supportive care prognosis is guarded Severe anxiety at baseline DVT treated Tracheostomy will remain in place   I have personally seen and evaluated the patient, evaluated laboratory and imaging results, formulated the assessment and plan and placed orders. The Patient requires high complexity decision making with multiple systems involvement.  Rounds were done with the Respiratory Therapy Director and Staff therapists and discussed with nursing staff also.  Yevonne Pax, MD [facility] Pulmonary Critical Care Medicine Sleep Medicine

## 2021-08-04 ENCOUNTER — Other Ambulatory Visit (HOSPITAL_COMMUNITY): Payer: Medicare Other

## 2021-08-04 DIAGNOSIS — F419 Anxiety disorder, unspecified: Secondary | ICD-10-CM | POA: Diagnosis not present

## 2021-08-04 DIAGNOSIS — J9621 Acute and chronic respiratory failure with hypoxia: Secondary | ICD-10-CM | POA: Diagnosis not present

## 2021-08-04 DIAGNOSIS — I82A19 Acute embolism and thrombosis of unspecified axillary vein: Secondary | ICD-10-CM | POA: Diagnosis not present

## 2021-08-04 DIAGNOSIS — G373 Acute transverse myelitis in demyelinating disease of central nervous system: Secondary | ICD-10-CM | POA: Diagnosis not present

## 2021-08-04 LAB — PROTIME-INR
INR: 2.5 — ABNORMAL HIGH (ref 0.8–1.2)
Prothrombin Time: 26.9 seconds — ABNORMAL HIGH (ref 11.4–15.2)

## 2021-08-04 NOTE — Progress Notes (Signed)
Pulmonary Critical Care Medicine [facility] [place]   PULMONARY CRITICAL CARE SERVICE  PROGRESS NOTE     Dominic Benjamin  DGU:[id number]  DOB: 1966/09/17   DOA: 07/01/2021  Referring Physician: Luna Kitchens, MD  HPI: Dominic Benjamin is a 55 y.o. male being followed for ventilator/airway/oxygen weaning Acute on Chronic Respiratory Failure.  Patient is comfortable right now without distress no fevers are noted  Medications: Reviewed on Rounds  Physical Exam:  Vitals: Temperature is 97.7 pulse 94 respiratory is 34 blood pressure is 103/62 saturations 100%  Ventilator Settings currently on assist control FiO2 is 100% tidal volume 450 PEEP of 5  General: Comfortable at this time Neck: supple Cardiovascular: no malignant arrhythmias Respiratory: No rhonchi no rales Skin: no rash seen on limited exam Musculoskeletal: No gross abnormality Psychiatric:unable to assess Neurologic:no involuntary movements         Lab Data:   Basic Metabolic Panel: No results for input(s): NA, K, CL, CO2, GLUCOSE, BUN, CREATININE, CALCIUM, MG, PHOS in the last 168 hours.  ABG: No results for input(s): PHART, PCO2ART, PO2ART, HCO3, O2SAT in the last 168 hours.  Liver Function Tests: No results for input(s): AST, ALT, ALKPHOS, BILITOT, PROT, ALBUMIN in the last 168 hours. No results for input(s): LIPASE, AMYLASE in the last 168 hours. No results for input(s): AMMONIA in the last 168 hours.  CBC: No results for input(s): WBC, NEUTROABS, HGB, HCT, MCV, PLT in the last 168 hours.  Cardiac Enzymes: No results for input(s): CKTOTAL, CKMB, CKMBINDEX, TROPONINI in the last 168 hours.  BNP (last 3 results) No results for input(s): BNP in the last 8760 hours.  ProBNP (last 3 results) No results for input(s): PROBNP in the last 8760 hours.  Radiological Exams: No results found.  Assessment/Plan Active Problems:   Acute on chronic respiratory failure with hypoxia (HCC)    Transverse myelitis (HCC)   Severe anxiety   DVT of axillary vein, acute (HCC)   Acute on chronic respiratory failure hypoxia patient has been increased to 100% however saturations are excellent right now respiratory therapy is going to wean the FiO2 down Transverse myelitis no change no improvement we will continue to monitor along closely Severe anxiety at baseline DVT treated    I have personally seen and evaluated the patient, evaluated laboratory and imaging results, formulated the assessment and plan and placed orders. The Patient requires high complexity decision making with multiple systems involvement.  Rounds were done with the Respiratory Therapy Director and Staff therapists and discussed with nursing staff also.  Yevonne Pax, MD [facility] [place] Pulmonary Critical Care Medicine Sleep Medicine

## 2021-08-05 ENCOUNTER — Emergency Department (HOSPITAL_COMMUNITY): Payer: Medicare Other

## 2021-08-05 ENCOUNTER — Encounter (HOSPITAL_COMMUNITY): Payer: Self-pay | Admitting: Internal Medicine

## 2021-08-05 ENCOUNTER — Inpatient Hospital Stay (HOSPITAL_COMMUNITY): Payer: Medicare Other

## 2021-08-05 ENCOUNTER — Other Ambulatory Visit (HOSPITAL_COMMUNITY): Payer: Medicare Other

## 2021-08-05 ENCOUNTER — Inpatient Hospital Stay (HOSPITAL_COMMUNITY)
Admission: EM | Admit: 2021-08-05 | Discharge: 2021-08-10 | DRG: 870 | Disposition: A | Discharge status: Other Acute Inpatient Hospital | Payer: Medicare Other | Source: Other Acute Inpatient Hospital | Attending: Pulmonary Disease | Admitting: Pulmonary Disease

## 2021-08-05 DIAGNOSIS — K219 Gastro-esophageal reflux disease without esophagitis: Secondary | ICD-10-CM | POA: Diagnosis present

## 2021-08-05 DIAGNOSIS — G8250 Quadriplegia, unspecified: Secondary | ICD-10-CM | POA: Diagnosis present

## 2021-08-05 DIAGNOSIS — G9341 Metabolic encephalopathy: Secondary | ICD-10-CM | POA: Diagnosis present

## 2021-08-05 DIAGNOSIS — J9601 Acute respiratory failure with hypoxia: Secondary | ICD-10-CM | POA: Insufficient documentation

## 2021-08-05 DIAGNOSIS — G2581 Restless legs syndrome: Secondary | ICD-10-CM | POA: Diagnosis present

## 2021-08-05 DIAGNOSIS — Z931 Gastrostomy status: Secondary | ICD-10-CM

## 2021-08-05 DIAGNOSIS — F419 Anxiety disorder, unspecified: Secondary | ICD-10-CM | POA: Diagnosis present

## 2021-08-05 DIAGNOSIS — Z86718 Personal history of other venous thrombosis and embolism: Secondary | ICD-10-CM | POA: Diagnosis not present

## 2021-08-05 DIAGNOSIS — Z66 Do not resuscitate: Secondary | ICD-10-CM | POA: Diagnosis present

## 2021-08-05 DIAGNOSIS — Z930 Tracheostomy status: Secondary | ICD-10-CM

## 2021-08-05 DIAGNOSIS — Z7401 Bed confinement status: Secondary | ICD-10-CM

## 2021-08-05 DIAGNOSIS — A419 Sepsis, unspecified organism: Secondary | ICD-10-CM | POA: Diagnosis present

## 2021-08-05 DIAGNOSIS — I6381 Other cerebral infarction due to occlusion or stenosis of small artery: Secondary | ICD-10-CM | POA: Diagnosis present

## 2021-08-05 DIAGNOSIS — R4182 Altered mental status, unspecified: Secondary | ICD-10-CM

## 2021-08-05 DIAGNOSIS — E43 Unspecified severe protein-calorie malnutrition: Secondary | ICD-10-CM | POA: Diagnosis present

## 2021-08-05 DIAGNOSIS — I248 Other forms of acute ischemic heart disease: Secondary | ICD-10-CM | POA: Diagnosis present

## 2021-08-05 DIAGNOSIS — I639 Cerebral infarction, unspecified: Secondary | ICD-10-CM | POA: Diagnosis present

## 2021-08-05 DIAGNOSIS — Z8616 Personal history of COVID-19: Secondary | ICD-10-CM | POA: Diagnosis not present

## 2021-08-05 DIAGNOSIS — I6389 Other cerebral infarction: Secondary | ICD-10-CM | POA: Diagnosis not present

## 2021-08-05 DIAGNOSIS — Z789 Other specified health status: Secondary | ICD-10-CM | POA: Diagnosis not present

## 2021-08-05 DIAGNOSIS — E785 Hyperlipidemia, unspecified: Secondary | ICD-10-CM | POA: Diagnosis present

## 2021-08-05 DIAGNOSIS — J9621 Acute and chronic respiratory failure with hypoxia: Secondary | ICD-10-CM | POA: Diagnosis present

## 2021-08-05 DIAGNOSIS — L89150 Pressure ulcer of sacral region, unstageable: Secondary | ICD-10-CM | POA: Diagnosis present

## 2021-08-05 DIAGNOSIS — N3000 Acute cystitis without hematuria: Secondary | ICD-10-CM | POA: Diagnosis not present

## 2021-08-05 DIAGNOSIS — I10 Essential (primary) hypertension: Secondary | ICD-10-CM | POA: Diagnosis present

## 2021-08-05 DIAGNOSIS — I69351 Hemiplegia and hemiparesis following cerebral infarction affecting right dominant side: Secondary | ICD-10-CM | POA: Diagnosis not present

## 2021-08-05 DIAGNOSIS — Z79899 Other long term (current) drug therapy: Secondary | ICD-10-CM

## 2021-08-05 DIAGNOSIS — R29737 NIHSS score 37: Secondary | ICD-10-CM | POA: Diagnosis present

## 2021-08-05 DIAGNOSIS — E876 Hypokalemia: Secondary | ICD-10-CM | POA: Diagnosis not present

## 2021-08-05 DIAGNOSIS — E875 Hyperkalemia: Secondary | ICD-10-CM | POA: Diagnosis present

## 2021-08-05 DIAGNOSIS — G894 Chronic pain syndrome: Secondary | ICD-10-CM | POA: Diagnosis present

## 2021-08-05 DIAGNOSIS — R6521 Severe sepsis with septic shock: Secondary | ICD-10-CM | POA: Diagnosis present

## 2021-08-05 DIAGNOSIS — G9340 Encephalopathy, unspecified: Secondary | ICD-10-CM | POA: Diagnosis not present

## 2021-08-05 DIAGNOSIS — G8324 Monoplegia of upper limb affecting left nondominant side: Secondary | ICD-10-CM | POA: Diagnosis not present

## 2021-08-05 DIAGNOSIS — Z452 Encounter for adjustment and management of vascular access device: Secondary | ICD-10-CM

## 2021-08-05 DIAGNOSIS — D6959 Other secondary thrombocytopenia: Secondary | ICD-10-CM | POA: Diagnosis present

## 2021-08-05 DIAGNOSIS — Z7189 Other specified counseling: Secondary | ICD-10-CM | POA: Diagnosis not present

## 2021-08-05 DIAGNOSIS — R64 Cachexia: Secondary | ICD-10-CM | POA: Diagnosis present

## 2021-08-05 DIAGNOSIS — J189 Pneumonia, unspecified organism: Secondary | ICD-10-CM | POA: Diagnosis present

## 2021-08-05 DIAGNOSIS — Z9911 Dependence on respirator [ventilator] status: Secondary | ICD-10-CM

## 2021-08-05 DIAGNOSIS — G373 Acute transverse myelitis in demyelinating disease of central nervous system: Secondary | ICD-10-CM | POA: Diagnosis present

## 2021-08-05 DIAGNOSIS — Z515 Encounter for palliative care: Secondary | ICD-10-CM | POA: Diagnosis not present

## 2021-08-05 DIAGNOSIS — E872 Acidosis: Secondary | ICD-10-CM | POA: Diagnosis present

## 2021-08-05 DIAGNOSIS — U071 COVID-19: Secondary | ICD-10-CM | POA: Insufficient documentation

## 2021-08-05 DIAGNOSIS — Z7982 Long term (current) use of aspirin: Secondary | ICD-10-CM

## 2021-08-05 DIAGNOSIS — Z6822 Body mass index (BMI) 22.0-22.9, adult: Secondary | ICD-10-CM

## 2021-08-05 DIAGNOSIS — L89893 Pressure ulcer of other site, stage 3: Secondary | ICD-10-CM | POA: Diagnosis present

## 2021-08-05 DIAGNOSIS — L98499 Non-pressure chronic ulcer of skin of other sites with unspecified severity: Secondary | ICD-10-CM | POA: Diagnosis present

## 2021-08-05 DIAGNOSIS — Z7901 Long term (current) use of anticoagulants: Secondary | ICD-10-CM

## 2021-08-05 DIAGNOSIS — L8990 Pressure ulcer of unspecified site, unspecified stage: Secondary | ICD-10-CM | POA: Insufficient documentation

## 2021-08-05 DIAGNOSIS — Z8673 Personal history of transient ischemic attack (TIA), and cerebral infarction without residual deficits: Secondary | ICD-10-CM | POA: Diagnosis not present

## 2021-08-05 DIAGNOSIS — D696 Thrombocytopenia, unspecified: Secondary | ICD-10-CM | POA: Diagnosis not present

## 2021-08-05 DIAGNOSIS — E1165 Type 2 diabetes mellitus with hyperglycemia: Secondary | ICD-10-CM | POA: Insufficient documentation

## 2021-08-05 DIAGNOSIS — R6520 Severe sepsis without septic shock: Secondary | ICD-10-CM | POA: Diagnosis not present

## 2021-08-05 LAB — RESP PANEL BY RT-PCR (FLU A&B, COVID) ARPGX2
Influenza A by PCR: NEGATIVE
Influenza B by PCR: NEGATIVE
SARS Coronavirus 2 by RT PCR: POSITIVE — AB

## 2021-08-05 LAB — LACTIC ACID, PLASMA
Lactic Acid, Venous: 2.6 mmol/L (ref 0.5–1.9)
Lactic Acid, Venous: 5 mmol/L (ref 0.5–1.9)
Lactic Acid, Venous: 4.2 mmol/L (ref 0.5–1.9)

## 2021-08-05 LAB — I-STAT CHEM 8, ED
BUN: 68 mg/dL — ABNORMAL HIGH (ref 6–20)
Calcium, Ion: 0.9 mmol/L — ABNORMAL LOW (ref 1.15–1.40)
Chloride: 106 mmol/L (ref 98–111)
Creatinine, Ser: 0.6 mg/dL — ABNORMAL LOW (ref 0.61–1.24)
Glucose, Bld: 245 mg/dL — ABNORMAL HIGH (ref 70–99)
HCT: 50 % (ref 39.0–52.0)
Hemoglobin: 17 g/dL (ref 13.0–17.0)
Potassium: 5.4 mmol/L — ABNORMAL HIGH (ref 3.5–5.1)
Sodium: 135 mmol/L (ref 135–145)
TCO2: 21 mmol/L — ABNORMAL LOW (ref 22–32)

## 2021-08-05 LAB — BLOOD GAS, ARTERIAL
Acid-base deficit: 3.5 mmol/L — ABNORMAL HIGH (ref 0.0–2.0)
Bicarbonate: 19.9 mmol/L — ABNORMAL LOW (ref 20.0–28.0)
FIO2: 50
O2 Saturation: 98.9 %
Patient temperature: 36.9
pCO2 arterial: 29.5 mmHg — ABNORMAL LOW (ref 32.0–48.0)
pH, Arterial: 7.443 (ref 7.350–7.450)
pO2, Arterial: 134 mmHg — ABNORMAL HIGH (ref 83.0–108.0)

## 2021-08-05 LAB — CBC WITH DIFFERENTIAL/PLATELET
Abs Immature Granulocytes: 0.2 10*3/uL — ABNORMAL HIGH (ref 0.00–0.07)
Basophils Absolute: 0 10*3/uL (ref 0.0–0.1)
Basophils Relative: 0 %
Eosinophils Absolute: 0 10*3/uL (ref 0.0–0.5)
Eosinophils Relative: 0 %
HCT: 53.1 % — ABNORMAL HIGH (ref 39.0–52.0)
Hemoglobin: 15.9 g/dL (ref 13.0–17.0)
Immature Granulocytes: 1 %
Lymphocytes Relative: 9 %
Lymphs Abs: 1.3 10*3/uL (ref 0.7–4.0)
MCH: 28.1 pg (ref 26.0–34.0)
MCHC: 29.9 g/dL — ABNORMAL LOW (ref 30.0–36.0)
MCV: 93.8 fL (ref 80.0–100.0)
Monocytes Absolute: 1.6 10*3/uL — ABNORMAL HIGH (ref 0.1–1.0)
Monocytes Relative: 11 %
Neutro Abs: 11.8 10*3/uL — ABNORMAL HIGH (ref 1.7–7.7)
Neutrophils Relative %: 79 %
Platelets: 83 10*3/uL — ABNORMAL LOW (ref 150–400)
RBC: 5.66 MIL/uL (ref 4.22–5.81)
RDW: 20.9 % — ABNORMAL HIGH (ref 11.5–15.5)
WBC: 15 10*3/uL — ABNORMAL HIGH (ref 4.0–10.5)
nRBC: 0.3 % — ABNORMAL HIGH (ref 0.0–0.2)

## 2021-08-05 LAB — I-STAT VENOUS BLOOD GAS, ED
Acid-base deficit: 4 mmol/L — ABNORMAL HIGH (ref 0.0–2.0)
Bicarbonate: 21.3 mmol/L (ref 20.0–28.0)
Calcium, Ion: 0.9 mmol/L — ABNORMAL LOW (ref 1.15–1.40)
HCT: 51 % (ref 39.0–52.0)
Hemoglobin: 17.3 g/dL — ABNORMAL HIGH (ref 13.0–17.0)
O2 Saturation: 93 %
Potassium: 5.4 mmol/L — ABNORMAL HIGH (ref 3.5–5.1)
Sodium: 135 mmol/L (ref 135–145)
TCO2: 22 mmol/L (ref 22–32)
pCO2, Ven: 39.9 mmHg — ABNORMAL LOW (ref 44.0–60.0)
pH, Ven: 7.335 (ref 7.250–7.430)
pO2, Ven: 70 mmHg — ABNORMAL HIGH (ref 32.0–45.0)

## 2021-08-05 LAB — COMPREHENSIVE METABOLIC PANEL
ALT: 79 U/L — ABNORMAL HIGH (ref 0–44)
ALT: 91 U/L — ABNORMAL HIGH (ref 0–44)
ALT: 82 U/L — ABNORMAL HIGH (ref 0–44)
AST: 76 U/L — ABNORMAL HIGH (ref 15–41)
AST: 86 U/L — ABNORMAL HIGH (ref 15–41)
AST: 59 U/L — ABNORMAL HIGH (ref 15–41)
Albumin: 3.2 g/dL — ABNORMAL LOW (ref 3.5–5.0)
Albumin: 3.4 g/dL — ABNORMAL LOW (ref 3.5–5.0)
Albumin: 2.8 g/dL — ABNORMAL LOW (ref 3.5–5.0)
Alkaline Phosphatase: 112 U/L (ref 38–126)
Alkaline Phosphatase: 123 U/L (ref 38–126)
Alkaline Phosphatase: 105 U/L (ref 38–126)
Anion gap: 11 (ref 5–15)
Anion gap: 15 (ref 5–15)
Anion gap: 13 (ref 5–15)
BUN: 64 mg/dL — ABNORMAL HIGH (ref 6–20)
BUN: 63 mg/dL — ABNORMAL HIGH (ref 6–20)
BUN: 58 mg/dL — ABNORMAL HIGH (ref 6–20)
CO2: 20 mmol/L — ABNORMAL LOW (ref 22–32)
CO2: 20 mmol/L — ABNORMAL LOW (ref 22–32)
CO2: 20 mmol/L — ABNORMAL LOW (ref 22–32)
Calcium: 8.1 mg/dL — ABNORMAL LOW (ref 8.9–10.3)
Calcium: 8.4 mg/dL — ABNORMAL LOW (ref 8.9–10.3)
Calcium: 7.7 mg/dL — ABNORMAL LOW (ref 8.9–10.3)
Chloride: 105 mmol/L (ref 98–111)
Chloride: 102 mmol/L (ref 98–111)
Chloride: 101 mmol/L (ref 98–111)
Creatinine, Ser: 0.61 mg/dL (ref 0.61–1.24)
Creatinine, Ser: 0.72 mg/dL (ref 0.61–1.24)
Creatinine, Ser: 0.55 mg/dL — ABNORMAL LOW (ref 0.61–1.24)
GFR, Estimated: >60 mL/min (ref >60)
GFR, Estimated: >60 mL/min (ref >60)
GFR, Estimated: >60 mL/min (ref >60)
Glucose, Bld: 263 mg/dL — ABNORMAL HIGH (ref 70–99)
Glucose, Bld: 244 mg/dL — ABNORMAL HIGH (ref 70–99)
Glucose, Bld: 284 mg/dL — ABNORMAL HIGH (ref 70–99)
Potassium: 5.2 mmol/L — ABNORMAL HIGH (ref 3.5–5.1)
Potassium: 5.4 mmol/L — ABNORMAL HIGH (ref 3.5–5.1)
Potassium: 3.6 mmol/L (ref 3.5–5.1)
Sodium: 136 mmol/L (ref 135–145)
Sodium: 137 mmol/L (ref 135–145)
Sodium: 134 mmol/L — ABNORMAL LOW (ref 135–145)
Total Bilirubin: 1.2 mg/dL (ref 0.3–1.2)
Total Bilirubin: 1.4 mg/dL — ABNORMAL HIGH (ref 0.3–1.2)
Total Bilirubin: 1.4 mg/dL — ABNORMAL HIGH (ref 0.3–1.2)
Total Protein: 5.7 g/dL — ABNORMAL LOW (ref 6.5–8.1)
Total Protein: 6 g/dL — ABNORMAL LOW (ref 6.5–8.1)
Total Protein: 4.9 g/dL — ABNORMAL LOW (ref 6.5–8.1)

## 2021-08-05 LAB — URINALYSIS, ROUTINE W REFLEX MICROSCOPIC
Bilirubin Urine: NEGATIVE
Glucose, UA: NEGATIVE mg/dL
Ketones, ur: NEGATIVE mg/dL
Nitrite: NEGATIVE
Protein, ur: >=300 mg/dL — AB
RBC / HPF: >50 RBC/hpf — ABNORMAL HIGH (ref 0–5)
Specific Gravity, Urine: 1.025 (ref 1.005–1.030)
pH: 5 (ref 5.0–8.0)

## 2021-08-05 LAB — TROPONIN I (HIGH SENSITIVITY)
Troponin I (High Sensitivity): 558 ng/L (ref <18)
Troponin I (High Sensitivity): 626 ng/L (ref <18)

## 2021-08-05 LAB — AMMONIA: Ammonia: 44 umol/L — ABNORMAL HIGH (ref 9–35)

## 2021-08-05 LAB — PROTIME-INR
INR: 2 — ABNORMAL HIGH (ref 0.8–1.2)
INR: 2 — ABNORMAL HIGH (ref 0.8–1.2)
Prothrombin Time: 22.5 seconds — ABNORMAL HIGH (ref 11.4–15.2)
Prothrombin Time: 22.4 seconds — ABNORMAL HIGH (ref 11.4–15.2)

## 2021-08-05 LAB — CBG MONITORING, ED: Glucose-Capillary: 241 mg/dL — ABNORMAL HIGH (ref 70–99)

## 2021-08-05 LAB — CBC
HCT: 49.5 % (ref 39.0–52.0)
Hemoglobin: 15.4 g/dL (ref 13.0–17.0)
MCH: 27.8 pg (ref 26.0–34.0)
MCHC: 31.1 g/dL (ref 30.0–36.0)
MCV: 89.4 fL (ref 80.0–100.0)
Platelets: 81 10*3/uL — ABNORMAL LOW (ref 150–400)
RBC: 5.54 MIL/uL (ref 4.22–5.81)
RDW: 20.6 % — ABNORMAL HIGH (ref 11.5–15.5)
WBC: 12.5 10*3/uL — ABNORMAL HIGH (ref 4.0–10.5)
nRBC: 0.3 % — ABNORMAL HIGH (ref 0.0–0.2)

## 2021-08-05 LAB — CK TOTAL AND CKMB (NOT AT ARMC)
CK, MB: 2.8 ng/mL (ref 0.5–5.0)
Relative Index: INVALID (ref 0.0–2.5)
Total CK: 46 U/L — ABNORMAL LOW (ref 49–397)

## 2021-08-05 LAB — HEMOGLOBIN A1C
Hgb A1c MFr Bld: 6.2 % — ABNORMAL HIGH (ref 4.8–5.6)
Mean Plasma Glucose: 131.24 mg/dL

## 2021-08-05 LAB — GLUCOSE, CAPILLARY
Glucose-Capillary: 259 mg/dL — ABNORMAL HIGH (ref 70–99)
Glucose-Capillary: 298 mg/dL — ABNORMAL HIGH (ref 70–99)
Glucose-Capillary: 309 mg/dL — ABNORMAL HIGH (ref 70–99)

## 2021-08-05 LAB — CK: Total CK: 85 U/L (ref 49–397)

## 2021-08-05 LAB — HIV ANTIBODY (ROUTINE TESTING W REFLEX): HIV Screen 4th Generation wRfx: NONREACTIVE

## 2021-08-05 LAB — PROCALCITONIN: Procalcitonin: 1.1 ng/mL

## 2021-08-05 LAB — C DIFFICILE QUICK SCREEN W PCR REFLEX
C Diff antigen: NEGATIVE
C Diff interpretation: NOT DETECTED
C Diff toxin: NEGATIVE

## 2021-08-05 LAB — APTT: aPTT: 33 seconds (ref 24–36)

## 2021-08-05 LAB — MAGNESIUM: Magnesium: 2 mg/dL (ref 1.7–2.4)

## 2021-08-05 MED ORDER — POLYETHYLENE GLYCOL 3350 17 G PO PACK: 17.0000 g | PACK | Freq: Every day | ORAL | Status: DC | PRN

## 2021-08-05 MED ORDER — METRONIDAZOLE 500 MG/100ML IV SOLN
500.0000 mg | Freq: Four times a day (QID) | INTRAVENOUS | Status: DC
  Administered 2021-08-05 – 2021-08-06 (×4): 500 mg via INTRAVENOUS
  Filled 2021-08-05 (×4): qty 100

## 2021-08-05 MED ORDER — ACETAMINOPHEN 650 MG RE SUPP
650.0000 mg | Freq: Once | RECTAL | Status: AC
  Administered 2021-08-05: 650 mg via RECTAL
  Filled 2021-08-05: qty 1

## 2021-08-05 MED ORDER — NOREPINEPHRINE 4 MG/250ML-% IV SOLN
0.0000 ug/min | INTRAVENOUS | Status: DC
  Administered 2021-08-05: 8 ug/min via INTRAVENOUS
  Administered 2021-08-06: 4 ug/min via INTRAVENOUS
  Filled 2021-08-05: qty 250

## 2021-08-05 MED ORDER — ACETAMINOPHEN 80 MG PO CHEW
80.0000 mg | CHEWABLE_TABLET | Freq: Four times a day (QID) | ORAL | Status: DC | PRN
  Filled 2021-08-05: qty 1

## 2021-08-05 MED ORDER — VITAL 1.5 CAL PO LIQD
1000.0000 mL | ORAL | Status: DC
  Administered 2021-08-05 – 2021-08-09 (×6): 1000 mL
  Filled 2021-08-05: qty 1000

## 2021-08-05 MED ORDER — ATORVASTATIN CALCIUM 40 MG PO TABS
40.0000 mg | ORAL_TABLET | Freq: Every day | ORAL | Status: DC
  Administered 2021-08-05 – 2021-08-09 (×5): 40 mg
  Filled 2021-08-05 (×5): qty 1

## 2021-08-05 MED ORDER — LACTATED RINGERS IV BOLUS (SEPSIS)
1000.0000 mL | Freq: Once | INTRAVENOUS | Status: AC
  Administered 2021-08-05: 1000 mL via INTRAVENOUS

## 2021-08-05 MED ORDER — NOREPINEPHRINE 4 MG/250ML-% IV SOLN
INTRAVENOUS | Status: AC
  Administered 2021-08-05: 10 ug/min via INTRAVENOUS
  Filled 2021-08-05: qty 250

## 2021-08-05 MED ORDER — LACTATED RINGERS IV SOLN
INTRAVENOUS | Status: DC
Start: 2021-08-05 — End: 2021-08-06

## 2021-08-05 MED ORDER — PROSOURCE TF PO LIQD
45.0000 mL | Freq: Three times a day (TID) | ORAL | Status: DC
  Administered 2021-08-05 – 2021-08-10 (×14): 45 mL
  Filled 2021-08-05 (×14): qty 45

## 2021-08-05 MED ORDER — VANCOMYCIN HCL 1250 MG/250ML IV SOLN
1250.0000 mg | Freq: Once | INTRAVENOUS | Status: AC
  Administered 2021-08-05: 1250 mg via INTRAVENOUS
  Filled 2021-08-05: qty 250

## 2021-08-05 MED ORDER — SODIUM CHLORIDE 0.9 % IV SOLN
2.0000 g | Freq: Once | INTRAVENOUS | Status: AC
  Administered 2021-08-05: 2 g via INTRAVENOUS
  Filled 2021-08-05: qty 2

## 2021-08-05 MED ORDER — COLLAGENASE 250 UNIT/GM EX OINT
TOPICAL_OINTMENT | Freq: Every day | CUTANEOUS | Status: DC
  Administered 2021-08-08 – 2021-08-09 (×2): 1 via TOPICAL
  Filled 2021-08-05: qty 30

## 2021-08-05 MED ORDER — FENTANYL BOLUS VIA INFUSION
50.0000 ug | INTRAVENOUS | Status: DC | PRN
  Filled 2021-08-05: qty 100

## 2021-08-05 MED ORDER — HEPARIN SODIUM (PORCINE) 5000 UNIT/ML IJ SOLN: 5000.0000 [IU] | Freq: Three times a day (TID) | INTRAMUSCULAR | Status: DC

## 2021-08-05 MED ORDER — VANCOMYCIN 50 MG/ML ORAL SOLUTION
500.0000 mg | Freq: Four times a day (QID) | ORAL | Status: DC
  Administered 2021-08-05 – 2021-08-06 (×3): 500 mg
  Filled 2021-08-05 (×4): qty 10

## 2021-08-05 MED ORDER — VANCOMYCIN HCL 750 MG/150ML IV SOLN
750.0000 mg | Freq: Two times a day (BID) | INTRAVENOUS | Status: DC
  Administered 2021-08-05: 750 mg via INTRAVENOUS
  Filled 2021-08-05 (×2): qty 150

## 2021-08-05 MED ORDER — CALCIUM GLUCONATE-NACL 1-0.675 GM/50ML-% IV SOLN: 1.0000 g | Freq: Once | INTRAVENOUS | Status: DC

## 2021-08-05 MED ORDER — VANCOMYCIN 50 MG/ML ORAL SOLUTION: 125.0000 mg | Freq: Four times a day (QID) | ORAL | Status: DC

## 2021-08-05 MED ORDER — INSULIN ASPART 100 UNIT/ML IJ SOLN
0.0000 [IU] | INTRAMUSCULAR | Status: DC
  Administered 2021-08-05: 3 [IU] via SUBCUTANEOUS
  Administered 2021-08-05 – 2021-08-06 (×2): 7 [IU] via SUBCUTANEOUS
  Administered 2021-08-06: 5 [IU] via SUBCUTANEOUS
  Administered 2021-08-06 (×2): 7 [IU] via SUBCUTANEOUS
  Administered 2021-08-06: 9 [IU] via SUBCUTANEOUS
  Administered 2021-08-06: 7 [IU] via SUBCUTANEOUS
  Administered 2021-08-06: 9 [IU] via SUBCUTANEOUS
  Administered 2021-08-07: 5 [IU] via SUBCUTANEOUS
  Administered 2021-08-07: 7 [IU] via SUBCUTANEOUS

## 2021-08-05 MED ORDER — SERTRALINE HCL 50 MG PO TABS: 50.0000 mg | ORAL_TABLET | Freq: Every day | ORAL | Status: DC

## 2021-08-05 MED ORDER — SODIUM CHLORIDE 0.9 % IV SOLN
INTRAVENOUS | Status: DC | PRN
  Administered 2021-08-05: 500 mL via INTRAVENOUS
  Administered 2021-08-06: 250 mL via INTRAVENOUS
  Administered 2021-08-07: 500 mL via INTRAVENOUS

## 2021-08-05 MED ORDER — FENTANYL 2500MCG IN NS 250ML (10MCG/ML) PREMIX INFUSION
50.0000 ug/h | INTRAVENOUS | Status: DC
  Administered 2021-08-05: 100 ug/h via INTRAVENOUS
  Filled 2021-08-05: qty 250

## 2021-08-05 MED ORDER — DOCUSATE SODIUM 50 MG/5ML PO LIQD: 100.0000 mg | Freq: Two times a day (BID) | ORAL | Status: DC | PRN

## 2021-08-05 MED ORDER — VITAL HIGH PROTEIN PO LIQD: 1000.0000 mL | ORAL | Status: DC

## 2021-08-05 MED ORDER — VANCOMYCIN HCL IN DEXTROSE 1-5 GM/200ML-% IV SOLN: 1000.0000 mg | Freq: Two times a day (BID) | INTRAVENOUS | Status: DC

## 2021-08-05 MED ORDER — METRONIDAZOLE 500 MG/100ML IV SOLN
500.0000 mg | Freq: Once | INTRAVENOUS | Status: AC
  Administered 2021-08-05: 500 mg via INTRAVENOUS
  Filled 2021-08-05: qty 100

## 2021-08-05 MED ORDER — PROSOURCE TF PO LIQD: 45.0000 mL | Freq: Two times a day (BID) | ORAL | Status: DC

## 2021-08-05 MED ORDER — CHLORHEXIDINE GLUCONATE CLOTH 2 % EX PADS
6.0000 | MEDICATED_PAD | Freq: Every day | CUTANEOUS | Status: DC
  Administered 2021-08-05 – 2021-08-10 (×6): 6 via TOPICAL

## 2021-08-05 MED ORDER — ACETAMINOPHEN 160 MG/5ML PO SOLN
650.0000 mg | ORAL | Status: DC | PRN
  Administered 2021-08-05 – 2021-08-10 (×3): 650 mg
  Filled 2021-08-05 (×3): qty 20.3

## 2021-08-05 MED ORDER — IPRATROPIUM-ALBUTEROL 0.5-2.5 (3) MG/3ML IN SOLN: 3.0000 mL | Freq: Four times a day (QID) | RESPIRATORY_TRACT | Status: DC | PRN

## 2021-08-05 MED ORDER — CEFEPIME HCL 2 G IJ SOLR
2.0000 g | Freq: Three times a day (TID) | INTRAMUSCULAR | Status: AC
  Administered 2021-08-05 – 2021-08-10 (×15): 2 g via INTRAVENOUS
  Filled 2021-08-05 (×16): qty 2

## 2021-08-05 MED ORDER — PANTOPRAZOLE 2 MG/ML SUSPENSION
40.0000 mg | Freq: Every day | ORAL | Status: DC
  Administered 2021-08-06: 40 mg

## 2021-08-05 MED ORDER — LACTULOSE 10 GM/15ML PO SOLN
20.0000 g | Freq: Once | ORAL | Status: DC
  Filled 2021-08-05: qty 30

## 2021-08-05 MED ORDER — CALCIUM GLUCONATE-NACL 2-0.675 GM/100ML-% IV SOLN
2.0000 g | Freq: Once | INTRAVENOUS | Status: AC
  Administered 2021-08-05: 2000 mg via INTRAVENOUS
  Filled 2021-08-05: qty 100

## 2021-08-05 NOTE — Progress Notes (Signed)
Pharmacy Antibiotic Note  Dominic Benjamin is a 55 y.o. male admitted on 08/05/2021 with sepsis.  Pharmacy has been consulted for vancomycin and cefepime dosing.  Plan: Vancomycin 1250mg  x1 then 1000mg  IV q12h  (eAUC 492, Cr used 0.8 - was rounded up from 0.6mg /dL) Cefepime 2g IV -Monitor renal function, clinical status, and antibiotic plan -Order vanc levels as necessary   Weight: 65 kg (143 lb 4.8 oz)  No data recorded.  Recent Labs  Lab 08/05/21 0537  CREATININE 0.61  LATICACIDVEN 2.6*    CrCl cannot be calculated (Unknown ideal weight.).    Not on File  Antimicrobials this admission: Vanc 9/16 >>  Cefepime 9/16 >>   Flagyl x1 dose in ED  Microbiology results: 9/16 BCx:  9/16 UCx:  9/16 TA:  Thank you for allowing pharmacy to be a part of this patient's care.  10/16, PharmD, [facility] Emergency Medicine Clinical Pharmacist ED RPh Phone: [phone] Main RX: [phone]

## 2021-08-05 NOTE — Progress Notes (Signed)
EEG complete - results pending 

## 2021-08-05 NOTE — Procedures (Signed)
Arterial Catheter Insertion Procedure Note  Dominic Benjamin  [id number]  01-Aug-1966  Date:08/05/21  Time:4:05 PM    Provider Performing: Lidia Collum    Procedure: Insertion of Arterial Line (51700) with US guidance (17494)   Indication(s) Blood pressure monitoring and/or need for frequent ABGs  Consent Unable to obtain consent due to emergent nature of procedure.  Anesthesia None   Time Out Verified patient identification, verified procedure, site/side was marked, verified correct patient position, special equipment/implants available, medications/allergies/relevant history reviewed, required imaging and test results available.   Sterile Technique Maximal sterile technique including full sterile barrier drape, hand hygiene, sterile gown, sterile gloves, mask, hair covering, sterile ultrasound probe cover (if used).   Procedure Description Area of catheter insertion was cleaned with chlorhexidine and draped in sterile fashion. With real-time ultrasound guidance an arterial catheter was placed into the right radial artery.  Appropriate arterial tracings confirmed on monitor.     Complications/Tolerance None; patient tolerated the procedure well.   EBL Minimal   Specimen(s) None  Dominic Benjamin Pulmonary & Critical Care 08/05/2021, 4:05 PM  Please see [email or web address] for pager details.  From 7A-7P if no response, please call [phone]. After hours, please call ELink [phone].

## 2021-08-05 NOTE — ED Notes (Signed)
Attempt to obtain 2nd blood culture unsuccessful.

## 2021-08-05 NOTE — Progress Notes (Signed)
Initial Nutrition Assessment  DOCUMENTATION CODES:   Severe malnutrition in context of chronic illness  INTERVENTION:   Tube Feeding via OG: Vital 1.5 at 60 ml/hr Begin TF at 20 ml/hr; titrate by 10 mL q 8 hours until goal rate of 60 ml/hr Pro-Source TF 45 mL TID Provides 130 g, 2280 kcals and 1094 mL of free water   NUTRITION DIAGNOSIS:   Severe Malnutrition related to chronic illness as evidenced by severe fat depletion, severe muscle depletion.  GOAL:   Patient will meet greater than or equal to 90% of their needs  MONITOR:   TF tolerance, Vent status, Labs, Weight trends  REASON FOR ASSESSMENT:   Consult, Ventilator Enteral/tube feeding initiation and management  ASSESSMENT:   55 yo male admitted from [facility] in septic shock with high fevers and hypotension with acute metabolic encephalopathy. Acute on chronic respiratory failure. PMH includes recent COVID-19 infection, chronic respiratory failure-chronic trach/vent and PEG,  quadriparesis with transverse myelitis, HTN, HLD, DM, chronic left lacunar infarct, chronic wounds, chronic pain  Remains on vent support via trach, requiring levophed MV: 13.2 L/min Temp (24hrs), Avg:103.6 F (39.8 C), Min:99.9 F (37.7 C), Max:106 F (41.1 C)  Per RN, PEG tube was clogged upon arrival but currently functioning. Unsure of TF regimen PTA currently.   +diarrhea, FMS in place, C.diff pending. Noted outpatient meds include banana flakes, acidophilus lactobacillus, nutrisource fiber indicating pt may have been experiencing diarrhea PTA as well  WOC RN consulted for unstageable PI to sacrum  Current wt 65 kg; no previous weight encounters.   Labs: potassium 5.4 (H)-improved to 3.6-noted outpatient meds include potassium chloride, banana flakes, potassium bicarbonate in addition to kayexalate Meds: ss novolog   NUTRITION - FOCUSED PHYSICAL EXAM:  Flowsheet Row Most Recent Value  Orbital Region Severe depletion  Upper  Arm Region Severe depletion  Thoracic and Lumbar Region Severe depletion  Buccal Region Severe depletion  Temple Region Severe depletion  Clavicle Bone Region Severe depletion  Clavicle and Acromion Bone Region Severe depletion  Scapular Bone Region Severe depletion  Dorsal Hand Severe depletion  Patellar Region Severe depletion  Anterior Thigh Region Severe depletion  Posterior Calf Region Severe depletion  Edema (RD Assessment) None       Diet Order:   Diet Order             Diet NPO time specified  Diet effective now                   EDUCATION NEEDS:   Not appropriate for education at this time  Skin:  Skin Assessment: Skin Integrity Issues: Skin Integrity Issues:: Unstageable Unstageable: sacrum  Last BM:  9/16 diarrhea  Height:   Ht Readings from Last 1 Encounters:  08/05/21 5\' 7"  (1.702 m)    Weight:   Wt Readings from Last 1 Encounters:  08/05/21 65 kg     BMI:  Body mass index is 22.44 kg/m.  Estimated Nutritional Needs:   Kcal:  2200-2400 kcals  Protein:  130-145 g  Fluid:  >/= 2 L   08/07/21 MS, RDN, LDN, CNSC Registered Dietitian III Clinical Nutrition RD Pager and On-Call Pager Number Located in [place]

## 2021-08-05 NOTE — H&P (Signed)
NAME:  Dominic Benjamin, MRN:  [id number], DOB:  1966/09/20, LOS: 0 ADMISSION DATE:  08/05/2021, CONSULTATION DATE:  9/16 REFERRING MD:  Dr. 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  Patient last hospital admission on July 2022 w/ AKI, quadriparesis w/ transverse myelitis and resp failure. Patient was trached on June 23, 2021. Followed by Dr. Welton Flakes pulmonology for vent management.  Patient presents to [facility] ED on 9/16 w/ AMS and R sided weakness. CT Potentially acute perforator infarct at the left basal ganglia and corona radiata. Neuro consulted. Recommend MRI when stable. Trach on mechanical ventilation. Fever 106 F. WBC 15. UA w/ leukocytes and bacteria. CXR unremarkable. Started on cefepime and vanc. Lactate 5. Started on Levo. ABG 7.33, 39, 70, 21. K 5.4, glucose 245, BUN 68, Creat 0.60, ionized calcium 0.9. covid positive.  PCCM consulted for icu admission and vent/medical management.  Pertinent  Medical History  Trach (06/23/2021) on mechanical ventilation at facility GERD DMT2 w/ right foot ulcer and diabetic ulcer of left great toe Osteomyelitis of R fifth toe Osteomyelitis of L ankle HTN HLD Anxiety CVA Chronic back pain on opioids Cellulitis of R ankle Restless leg syndrome DVT axillary vein  Significant Hospital Events: Including procedures, antibiotic start and stop dates in addition to other pertinent events   9/16: admitted R sided weakness, sepsis, and AMS; trached on vent  Interim History / Subjective:  Patient trached on mechanical vent PRVC 40% +5 Sats 96% On levo 6 Patient not awake or following  commands PERRL 2 mm bilaterally; no corneal reflex; positive cough/gag reflex   Objective   Blood pressure (!) 102/53, pulse 94, temperature (!) 106 F (41.1 C), temperature source Rectal, resp. rate (!) 32, height 5\' 7"  (1.702 m), weight 65 kg, SpO2 96 %.        Intake/Output Summary (Last 24 hours) at 08/05/2021 0949 Last data filed at 08/05/2021 08/07/2021 Gross per 24 hour  Intake 195.2 ml  Output --  Net 195.2 ml   Filed Weights   08/05/21 0700  Weight: 65 kg    Examination: General:  critically ill appearing on mech vent HEENT: MM pink/moist; ETT in place Neuro:not awake or following commands; PERRL 2 mm bilaterally; no corneal reflex; positive cough/gag reflex CV: s1s2, sinus tachy, no m/r/g PULM:  dim rhonchi BS bilaterally; on mech vent PRVC GI: soft, bsx4 active; PEG tube in place without drainage or redness Extremities: warm/dry Skin: no rashes or lesions appreciated   Resolved Hospital Problem list     Assessment & Plan:   Code stroke: CT 9/16 acute perforator infarct at the left basal ganglia and corona radiata Chronic lacunar CVA w/ R sided residual mild spastic hemiparesis P: -neuro following; appreciate recs -plan for MRI when stable -neuro recommend holding on tpa for now due to increased INR  Septic shock UTI Lactic acidosis Elevated troponin P: -Bcx2 pending -Trach aspirate sent -Urine culture sent -Trend lactate -trend troponin -check procalcitonin -continue cefepime and vanc -wean levo for MAP goal >65 -continue IV fluids 150 ml/hr -tylenol for fever -trend bmp/cbc -palliative consulted  Acute on chronic respiratory failure w/ hypoxia Chronic trach vent  dependent: trached 06/2021 quadriparesis w/ transverse myelitis Positive covid P: -continue mechanical ventilation PRVC  -wean fio2 for sats >90% -prn duoneb for wheezing -vap prevention in place -Trach care protocol in place -pharm consult placed for covid treatment  Acute  metabolic encephalopathy: likely septic related compared to stroke; covid encephalopathy; elevated ammonia P: -monitor neuro checks -limit sedating meds -ammonia elevated; giving lactulose  Hyperkalemia Hypocalcemia P: -calcium gluconate given -repeat ionized calcium and cmp later today  Hx of anxiety P: -started home sertraline -wean sedation for RASS 0 to -1  Hx of restless leg syndrome P: -consider restarting home ropinirole  DMT2 P: -SSI and CBG monitoring -A1c  Elevated LFTs and ammonia P: -give lactulose -trend CMP -consider RUQ ultrasound  Thrombocytopenia: appears chronic P: -trend platelets  GERD P: -PPI  Hx of HTN and HLD P: -hold HTN meds -continue statin  Best Practice (right click and "Reselect all SmartList Selections" daily)   Diet/type: NPO DVT prophylaxis: prophylactic heparin  GI prophylaxis: PPI Lines: Central line Foley:  N/A Code Status:  full code Last date of multidisciplinary goals of care discussion [pending]  Labs   CBC: Recent Labs  Lab 08/05/21 0537 08/05/21 0751  WBC 12.5*  --   HGB 15.4 17.0  17.3*  HCT 49.5 50.0  51.0  MCV 89.4  --   PLT 81*  --     Basic Metabolic Panel: Recent Labs  Lab 08/05/21 0537 08/05/21 0737 08/05/21 0751  NA 136 137 135  135  K 5.2* 5.4* 5.4*  5.4*  CL 105 102 106  CO2 20* 20*  --   GLUCOSE 263* 244* 245*  BUN 64* 63* 68*  CREATININE 0.61 0.72 0.60*  CALCIUM 8.1* 8.4*  --   MG 2.0  --   --    GFR: Estimated Creatinine Clearance: 99.3 mL/min (A) (by C-G formula based on SCr of 0.6 mg/dL (L)). Recent Labs  Lab 08/05/21 0537 08/05/21 0754  WBC 12.5*  --   LATICACIDVEN 2.6* 5.0*    Liver Function Tests: Recent Labs  Lab 08/05/21 0537 08/05/21 0737  AST 76* 86*  ALT 79* 91*  ALKPHOS 112 123  BILITOT 1.2 1.4*  PROT 5.7* 6.0*  ALBUMIN 3.2* 3.4*   No results for input(s): LIPASE, AMYLASE in the last 168 hours. Recent Labs  Lab 08/05/21 0537  AMMONIA 44*     ABG    Component Value Date/Time   PHART 7.443 08/05/2021 0525   PCO2ART 29.5 (L) 08/05/2021 0525   PO2ART 134 (H) 08/05/2021 0525   HCO3 21.3 08/05/2021 0751   TCO2 21 (L) 08/05/2021 0751   TCO2 22 08/05/2021 0751   ACIDBASEDEF 4.0 (H) 08/05/2021 0751   O2SAT 93.0 08/05/2021 0751     Coagulation Profile: Recent Labs  Lab 08/02/21 0526 08/03/21 0334 08/04/21 0452 08/05/21 0537 08/05/21 0737  INR 1.8* 2.1* 2.5* 2.0* 2.0*    Cardiac Enzymes: Recent Labs  Lab 08/05/21 0537  CKTOTAL 46*  CKMB 2.8    HbA1C: Hgb A1c MFr Bld  Date/Time Value Ref Range Status  07/10/2021 07:04 AM 6.2 (H) 4.8 - 5.6 % Final    Comment:    (NOTE) Pre diabetes:          5.7%-6.4%  Diabetes:              >6.4%  Glycemic control for   <7.0% adults with diabetes     CBG: No results for input(s): GLUCAP in the last 168 hours.  Review  of Systems:   Unable to obtain due to patient AMS and trached on vent. Obtained info from chart and bedside nurse  Past Medical History:  He,  has no past medical history on file.   Surgical History:     Social History:      Family History:  His family history is not on file.   Allergies No Known Allergies   Home Medications  Prior to Admission medications   Medication Sig Start Date End Date Taking? Authorizing Provider  ACIDOPHILUS LACTOBACILLUS PO Take 1 capsule by mouth daily.   Yes [provider]  Amino Acids-Protein Hydrolys (FEEDING SUPPLEMENT, PRO-STAT 64,) LIQD Take 30 mLs by mouth in the morning and at bedtime.   Yes [provider]  antiseptic oral rinse (BIOTENE) LIQD 15 mLs by Mouth Rinse route in the morning, at noon, in the evening, and at bedtime.   Yes [provider]  Banana Flakes (BANATROL PO) Take 1 packet by mouth in the morning and at bedtime.   Yes [provider]  Carboxymethylcellulose Sod PF (REFRESH PLUS) 0.5 % SOLN Place 1 drop into both eyes in the morning, at noon, in the  evening, and at bedtime.   Yes [provider]  diazepam (VALIUM) 5 MG tablet Take 5 mg by mouth 2 (two) times daily as needed for anxiety. Hold for HR < 55 or BP < 90/60   Yes [provider]  famotidine (PEPCID) 20 MG tablet Take 20 mg by mouth 2 (two) times daily.   Yes [provider]  insulin glargine (LANTUS) 100 UNIT/ML injection Inject 8 Units into the skin at bedtime. Hold if tube feeds stopped or NPO or BS is <70   Yes [provider]  lidocaine (LIDODERM) 5 % Place 1 patch onto the skin daily. Remove & Discard patch within 12 hours or as directed by MD   Yes [provider]  Omega-3 Fatty Acids (THE VERY FINEST FISH OIL) LIQD Take 6,000 mg by mouth daily.   Yes [provider]  oxyCODONE (OXY IR/ROXICODONE) 5 MG immediate release tablet Take 5 mg by mouth every 8 (eight) hours as needed for severe pain. Hold for HR <55 for BP < 90/60   Yes [provider]  rOPINIRole (REQUIP) 1 MG tablet Take 1 mg by mouth at bedtime.   Yes [provider]  sodium chloride 0.9 % infusion Inject 75 mLs into the vein continuous. 7ml/hour   Yes [provider]  warfarin (COUMADIN) 5 MG tablet Take 5 mg by mouth daily. Hold tube feeding from 1700 to 1800   Yes [provider]     Critical care time: 45 minutes     JD Anselm Lis Morrill Pulmonary & Critical Care 08/05/2021, 12:07 PM  Please see [email or web address] for pager details.  From 7A-7P if no response, please call [phone]. After hours, please call ELink [phone].

## 2021-08-05 NOTE — Progress Notes (Addendum)
Pharmacy Antibiotic Note  Dominic Benjamin is a 55 y.o. male admitted on 08/05/2021 with sepsis.  Pharmacy has been consulted for vancomycin and cefepime dosing. Patient was given vancomycin 1250 mg IV x1 with the plan to continue with vancomycin 1000 mg IV every 12 hours + cefepime 2 g IV every 8 hours. Vancomycin dose is changing based on quadriplegia/low muscle mass and scr doubling from baseline ~0.3 to 0.72.  Vancomycin 750 mg Q12 hr Scr used: 1 mg/dL (due to quadriplegia/low muscle mass and Scr doubled from baseline ~0.3) Weight: 65 kg Vd coeff: 0.72 L/kg Est AUC: 455.7   Plan: Continue cefepime 2 g IV every 8 hours Decrease from vancomycin 1000 mg every 12 hours to vancomycin 750 mg IV every 12 hours  Monitor clinical status, renal function, cultures   Height: 5\' 7"  (170.2 cm) Weight: 65 kg (143 lb 4.8 oz) IBW/kg (Calculated) : 66.1  Temp (24hrs), Avg:104.6 F (40.3 C), Min:103.1 F (39.5 C), Max:106 F (41.1 C)  Recent Labs  Lab 08/05/21 0537 08/05/21 0737 08/05/21 0751 08/05/21 0754 08/05/21 0952  WBC 12.5* 15.0*  --   --   --   CREATININE 0.61 0.72 0.60*  --   --   LATICACIDVEN 2.6*  --   --  5.0* 4.2*    Estimated Creatinine Clearance: 99.3 mL/min (A) (by C-G formula based on SCr of 0.6 mg/dL (L)).    No Known Allergies  Antimicrobials this admission: Vanc 9/16 > Cefepime 9/16 > Flagyl 9/16 >> PO Vanc 9/16 >>  Microbiology results: 9/16 Cdiff PCR > 9/16 BCx > 9/16 UCx >  9/16 sputum >   Thank you for allowing pharmacy to be a part of this patient's care.  10/16, PharmD PGY1 Acute Care Pharmacy Resident  Phone: [phone] 08/05/2021  4:17 PM  Please check [email or web address] for unit-specific pharmacy phone numbers.

## 2021-08-05 NOTE — Procedures (Signed)
Central Venous Catheter Insertion Procedure Note  Dominic Benjamin  [id number]  1966/02/15  Date:08/05/21  Time:3:39 PM   Provider Performing:Teara Duerksen R Jalene Lacko   Procedure: Insertion of Non-tunneled Central Venous Catheter(36556) with US guidance (34287)   Indication(s) Medication administration  Consent Unable to obtain consent due to emergent nature of procedure.  Anesthesia Topical only with 1% lidocaine   Timeout Verified patient identification, verified procedure, site/side was marked, verified correct patient position, special equipment/implants available, medications/allergies/relevant history reviewed, required imaging and test results available.  Sterile Technique Maximal sterile technique including full sterile barrier drape, hand hygiene, sterile gown, sterile gloves, mask, hair covering, sterile ultrasound probe cover (if used).  Procedure Description Area of catheter insertion was cleaned with chlorhexidine and draped in sterile fashion.  With real-time ultrasound guidance a central venous catheter was placed into the left subclavian vein. Nonpulsatile blood flow and easy flushing noted in all ports.  The catheter was sutured in place and sterile dressing applied.  Complications/Tolerance None; patient tolerated the procedure well. Chest X-ray is ordered to verify placement for internal jugular or subclavian cannulation.   Chest x-ray is not ordered for femoral cannulation.  EBL Minimal  Specimen(s) None  Dominic Gasman Mell Guia, PA-C

## 2021-08-05 NOTE — ED Notes (Signed)
Notified Butler MD of rectal temp of 106 F

## 2021-08-05 NOTE — Code Documentation (Signed)
Stroke Response Nurse Documentation Code Documentation  Dominic Benjamin is a 55 y.o. male arriving to [facility] ED via  [facility]  on 08/05/21 with past medical hx of paraplegia, chronic tracheostomy. Patient is typically able to communicate by nodding/gesturing, able to suction himself, track staff members, RUE strength > LUE. At this time unknown what  meds/anticoagulants . Code stroke was activated by ED.   Patient from [facility] where he was LKW at 0400 when found to have right sided gaze and paralysis/loss of sensation in upper extremities.   Stroke team at the bedside on patient activation. Pt to CT, airway already established, patient seen by EDP Charm Barges. Labs drawn once arrived back in ED16.  NIHSS 37, see documentation for details and code stroke times. Patient with decreased LOC, disoriented, not following commands, right gaze preference , right hemianopia, bilateral arm weakness, bilateral leg weakness, bilateral limb ataxia, bilateral decreased sensation, Global aphasia , dysarthria , and bilateral neglect on exam. The following imaging was completed: CT; CTA not completed due to difficulty with IV access and patient medically unstable. Patient is not a candidate for IV Thrombolytic due to patient medically unstable at this time.  Care/Plan: q2h vitals and mNIHSS.   Bedside handoff with ED RN Vernard Gambles.    Scarlette Slice K  Stroke Response RN

## 2021-08-05 NOTE — Progress Notes (Signed)
[facility] is following Code Sepsis 

## 2021-08-05 NOTE — ED Triage Notes (Signed)
Pt BIB [facility] RN staff to CT scanner 3 as an activated code stroke. Pt last known normal at 0400 am. Per staff patient is paraplegic but usually able to self suction trache tube and able to nod head to yes or no questions. Pt now exhibits L sided flaccidness, R sided contraction, R sided gaze, and no longer responsive to any verbal stimuli. Pt was unstable in CT, not oxygenating adequately and BP 70/50. Patient brought back to ED room for stabilization and sepsis protocol followed at this time.

## 2021-08-05 NOTE — ED Provider Notes (Signed)
[facility] EMERGENCY DEPARTMENT Provider Note   CSN: [id number] Arrival date & time: 08/05/21  0719     History No chief complaint on file.   Dominic Benjamin is a 55 y.o. male.  He is brought in from his care facility for evaluation of change in mental status.  Reported right-sided weakness.  Level 5 caveat secondary to unresponsive.  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  Reportedly had some right-sided weakness.  Febrile.  I evaluated the patient in the CT scanner where he had no access.  Unable to obtain neurologic exam secondary to unresponsiveness.  The history is provided by a caregiver.  Altered Mental Status Presenting symptoms: unresponsiveness   Most recent episode:  Today Episode history:  Single Timing:  Constant Progression:  Unchanged Chronicity:  New     No past medical history on file.  Patient Active Problem List   Diagnosis Date Noted   Septic shock (HCC) 08/05/2021   Stroke (HCC) 08/05/2021   Acute cystitis without hematuria    COVID    Acute metabolic encephalopathy    Uncontrolled type 2 diabetes mellitus with hyperglycemia (HCC)    Acute respiratory failure with hypoxia (HCC)    Tracheostomy dependence (HCC)    Acute on chronic respiratory failure with hypoxia (HCC) 07/06/2021   Transverse myelitis (HCC) 07/06/2021   Severe anxiety 07/06/2021   DVT of axillary vein, acute (HCC) 07/06/2021    History reviewed. No pertinent surgical history.     History reviewed. No pertinent family history.     Home Medications Prior to Admission medications   Medication Sig Start Date End Date Taking? Authorizing Provider  acetaminophen (TYLENOL) 325 MG tablet Take 650 mg by mouth every 6 (six) hours as needed (pain scale 1-4 or fever of >101).   Yes [provider]  Acetaminophen 650 MG/20.3ML SOLN Take 650 mg by mouth every 6 (six) hours as needed (nrs 1-3 and  cpot of 1-2).   Yes [provider]  ACIDOPHILUS LACTOBACILLUS PO Take 1 capsule by mouth daily.   Yes [provider]  Amino Acids-Protein Hydrolys (FEEDING SUPPLEMENT, PRO-STAT 64,) LIQD Take 30 mLs by mouth in the morning and at bedtime.   Yes [provider]  antiseptic oral rinse (BIOTENE) LIQD 15 mLs by Mouth Rinse route in the morning, at noon, in the evening, and at bedtime.   Yes [provider]  aspirin 81 MG chewable tablet Chew 81 mg by mouth daily.   Yes [provider]  atorvastatin (LIPITOR) 40 MG tablet Take 40 mg by mouth at bedtime.   Yes [provider]  baclofen (LIORESAL) 10 MG tablet Take 10 mg by mouth 3 (three) times daily.   Yes [provider]  Banana Flakes (BANATROL PO) Take 1 packet by mouth in the morning and at bedtime.   Yes [provider]  Carboxymethylcellulose Sod PF 0.5 % SOLN Place 1 drop into both eyes in the morning, at noon, in the evening, and at bedtime.   Yes [provider]  dextrose 50 % solution Inject 25 mLs into the vein as needed for low blood sugar.   Yes [provider]  Dextrose-Sodium Chloride (DEXTROSE 5 % AND 0.45% NACL) 5-0.45 % Inject 1,000 mLs into the vein as needed (hypoglycemia). 74ml/hour   Yes [provider]  diazepam (VALIUM) 5 MG tablet Take 5 mg by mouth 2 (two) times daily as needed for  anxiety. Hold for HR < 55 or BP < 90/60   Yes [provider]  famotidine (PEPCID) 20 MG tablet Take 20 mg by mouth 2 (two) times daily.   Yes [provider]  Glucagon HCl, rDNA, (GLUCAGEN HYPOKIT IJ) Inject 1 mg as directed as needed (hypoglycemia).   Yes [provider]  guaiFENesin 200 MG/10ML SOLN Take 200 mg by mouth every 8 (eight) hours.   Yes [provider]  Guar Gum (NUTRISOURCE FIBER PO) Take 1 packet by mouth in the morning, at noon, and at bedtime.   Yes [provider]   HYDROcodone-acetaminophen (NORCO/VICODIN) 5-325 MG tablet Take 1 tablet by mouth every 6 (six) hours as needed for moderate pain (for NRS 4-6 and CPOT of 1-2).   Yes [provider]  insulin glargine (LANTUS) 100 UNIT/ML injection Inject 8 Units into the skin at bedtime. Hold if tube feeds stopped or NPO or BS is <70   Yes [provider]  insulin lispro (HUMALOG) 100 UNIT/ML injection Inject 2-12 Units into the skin every 6 (six) hours. Per sliding scale   Yes [provider]  ipratropium-albuterol (DUONEB) 0.5-2.5 (3) MG/3ML SOLN Take 3 mLs by nebulization every 6 (six) hours as needed (wheezing/shortness of breath).   Yes [provider]  lidocaine (LIDODERM) 5 % Place 1 patch onto the skin daily. Remove & Discard patch within 12 hours or as directed by MD   Yes [provider]  magnesium oxide (MAG-OX) 400 MG tablet Take 400 mg by mouth every 6 (six) hours as needed (for 4 doses as needed for magnesium levels).   Yes [provider]  Magnesium Sulfate in D5W 10-5 GM/100ML-% SOLN Inject 1 g into the vein as needed (magnesium levels).   Yes [provider]  melatonin 3 MG TABS tablet Take 3 mg by mouth at bedtime.   Yes [provider]  methylPREDNISolone Ace-Lido 40-10 MG/ML SUSP Inject 40 mg into the vein in the morning and at bedtime.   Yes [provider]  Multiple Vitamin (TAB-A-VITE/BETA CAROTENE PO) Take 1 tablet by mouth daily.   Yes [provider]  Omega-3 Fatty Acids (THE VERY FINEST FISH OIL) LIQD Take 6,000 mg by mouth daily.   Yes [provider]  oxyCODONE (OXY IR/ROXICODONE) 5 MG immediate release tablet Take 5 mg by mouth every 8 (eight) hours as needed for severe pain. Hold for HR <55 for BP < 90/60   Yes [provider]  Potassium Bicarbonate (POTASSIUM EFFERVESCENT PO) Take 40 mEq by mouth every 4 (four) hours as needed (potassium levels).   Yes [provider]   potassium chloride 10 MEQ/100ML Inject 10 mEq into the vein as needed (potassium levels).   Yes [provider]  rOPINIRole (REQUIP) 1 MG tablet Take 1 mg by mouth at bedtime.   Yes [provider]  sertraline (ZOLOFT) 50 MG tablet Take 50 mg by mouth daily.   Yes [provider]  sodium chloride 0.9 % infusion Inject 75 mLs into the vein continuous. 5ml/hour   Yes [provider]  sodium polystyrene (KAYEXALATE) 15 GM/60ML suspension Take 15-30 g by mouth as needed (high potassium levels).   Yes [provider]  warfarin (COUMADIN) 5 MG tablet Take 5 mg by mouth daily. Hold tube feeding from 1700 to 1800   Yes [provider]    Allergies    Patient has no known allergies.  Review of Systems   Review of  Systems  Unable to perform ROS: Mental status change   Physical Exam Updated Vital Signs BP 109/73   Pulse 84   Temp 99.9 F (37.7 C) (Oral)   Resp (!) 22   Ht 5\' 7"  (1.702 m)   Wt 65 kg   SpO2 100%   BMI 22.44 kg/m   Physical Exam Vitals and nursing note reviewed.  Constitutional:      General: He is in acute distress.     Appearance: He is well-developed. He is ill-appearing.  HENT:     Head: Normocephalic and atraumatic.  Eyes:     Conjunctiva/sclera: Conjunctivae normal.  Neck:     Comments: Tracheostomy in place midline.  No crepitus. Cardiovascular:     Rate and Rhythm: Normal rate and regular rhythm.     Heart sounds: Murmur heard.  Pulmonary:     Effort: Pulmonary effort is normal. Tachypnea present. No respiratory distress.     Breath sounds: Normal breath sounds.  Abdominal:     Palpations: Abdomen is soft.     Tenderness: There is no abdominal tenderness. There is no guarding or rebound.  Genitourinary:    Penis: Normal.   Musculoskeletal:        General: No tenderness. Normal range of motion.  Skin:    General: Skin is warm and dry.  Neurological:     Comments: He has right-sided gaze  preference without any blink to confrontation.  Not moving upper or lower extremities to painful stimuli.  It sounds like his baseline mental status is nods appropriately, can use his right upper extremity to suction his mouth.    ED Results / Procedures / Treatments   Labs (all labs ordered are listed, but only abnormal results are displayed) Labs Reviewed  RESP PANEL BY RT-PCR (FLU A&B, COVID) ARPGX2 - Abnormal; Notable for the following components:      Result Value   SARS Coronavirus 2 by RT PCR POSITIVE (*)    All other components within normal limits  LACTIC ACID, PLASMA - Abnormal; Notable for the following components:   Lactic Acid, Venous 5.0 (*)    All other components within normal limits  LACTIC ACID, PLASMA - Abnormal; Notable for the following components:   Lactic Acid, Venous 4.2 (*)    All other components within normal limits  COMPREHENSIVE METABOLIC PANEL - Abnormal; Notable for the following components:   Potassium 5.4 (*)    CO2 20 (*)    Glucose, Bld 244 (*)    BUN 63 (*)    Calcium 8.4 (*)    Total Protein 6.0 (*)    Albumin 3.4 (*)    AST 86 (*)    ALT 91 (*)    Total Bilirubin 1.4 (*)    All other components within normal limits  CBC WITH DIFFERENTIAL/PLATELET - Abnormal; Notable for the following components:   WBC 15.0 (*)    HCT 53.1 (*)    MCHC 29.9 (*)    RDW 20.9 (*)    Platelets 83 (*)    nRBC 0.3 (*)    Neutro Abs 11.8 (*)    Monocytes Absolute 1.6 (*)    Abs Immature Granulocytes 0.20 (*)    All other components within normal limits  PROTIME-INR - Abnormal; Notable for the following components:   Prothrombin Time 22.4 (*)    INR 2.0 (*)    All other components within normal limits  URINALYSIS, ROUTINE W REFLEX MICROSCOPIC - Abnormal; Notable for the following  components:   Color, Urine AMBER (*)    APPearance TURBID (*)    Hgb urine dipstick LARGE (*)    Protein, ur >=300 (*)    Leukocytes,Ua MODERATE (*)    RBC / HPF >50 (*)     Bacteria, UA MANY (*)    All other components within normal limits  COMPREHENSIVE METABOLIC PANEL - Abnormal; Notable for the following components:   Sodium 134 (*)    CO2 20 (*)    Glucose, Bld 284 (*)    BUN 58 (*)    Creatinine, Ser 0.55 (*)    Calcium 7.7 (*)    Total Protein 4.9 (*)    Albumin 2.8 (*)    AST 59 (*)    ALT 82 (*)    Total Bilirubin 1.4 (*)    All other components within normal limits  HEMOGLOBIN A1C - Abnormal; Notable for the following components:   Hgb A1c MFr Bld 6.2 (*)    All other components within normal limits  I-STAT CHEM 8, ED - Abnormal; Notable for the following components:   Potassium 5.4 (*)    BUN 68 (*)    Creatinine, Ser 0.60 (*)    Glucose, Bld 245 (*)    Calcium, Ion 0.90 (*)    TCO2 21 (*)    All other components within normal limits  I-STAT VENOUS BLOOD GAS, ED - Abnormal; Notable for the following components:   pCO2, Ven 39.9 (*)    pO2, Ven 70.0 (*)    Acid-base deficit 4.0 (*)    Potassium 5.4 (*)    Calcium, Ion 0.90 (*)    Hemoglobin 17.3 (*)    All other components within normal limits  CBG MONITORING, ED - Abnormal; Notable for the following components:   Glucose-Capillary 241 (*)    All other components within normal limits  TROPONIN I (HIGH SENSITIVITY) - Abnormal; Notable for the following components:   Troponin I (High Sensitivity) 626 (*)    All other components within normal limits  C DIFFICILE QUICK SCREEN W PCR REFLEX    CULTURE, BLOOD (ROUTINE X 2)  CULTURE, BLOOD (ROUTINE X 2)  URINE CULTURE  CULTURE, RESPIRATORY W GRAM STAIN  APTT  HIV ANTIBODY (ROUTINE TESTING W REFLEX)  PROCALCITONIN  CK  MISCELLANEOUS GENETIC TEST  CALCIUM, IONIZED  COMPREHENSIVE METABOLIC PANEL  CBC  MAGNESIUM  PROCALCITONIN  PROTIME-INR    EKG EKG Interpretation  Date/Time:  Friday August 05 2021 07:57:47 EDT Ventricular Rate:  102 PR Interval:  170 QRS Duration: 86 QT Interval:  373 QTC Calculation: 486 R  Axis:   96 Text Interpretation: Sinus tachycardia Biatrial enlargement Borderline right axis deviation ST depr, consider ischemia, inferior leads Borderline ST elevation, anterolateral leads Borderline prolonged QT interval increased rate and ischemic changes from prior 9/22 Confirmed by Meridee Score [phone]) on 08/05/2021 8:00:39 AM  Radiology DG CHEST PORT 1 VIEW  Result Date: 08/04/2021 CLINICAL DATA:  Fever EXAM: PORTABLE CHEST 1 VIEW COMPARISON:  07/27/2021 FINDINGS: Persistent small pneumomediastinum. No residual pneumothorax. Unchanged position of tracheostomy tube. Lungs are clear. No pleural effusion. IMPRESSION: Persistent pneumomediastinum. No residual pneumothorax. Electronically Signed   By: Deatra Robinson M.D.   On: 08/04/2021 23:24   CT HEAD CODE STROKE WO CONTRAST  Result Date: 08/05/2021 CLINICAL DATA:  Code stroke.  Right-sided gaze and aphasia EXAM: CT HEAD WITHOUT CONTRAST TECHNIQUE: Contiguous axial images were obtained from the base of the skull through the vertex without intravenous contrast. COMPARISON:  None. FINDINGS: Brain: Band of indistinct low-density at the left basal ganglia and corona radiata. No hemorrhage, hydrocephalus, or masslike finding. Vascular: No hyperdense vessel. Skull: Normal. Negative for fracture or focal lesion. Sinuses/Orbits: Negative Other: Motion artifact. ASPECTS [facility] Stroke Program Early CT Score) - Ganglionic level infarction (caudate, lentiform nuclei, internal capsule, insula, M1-M3 cortex): 6 - Supraganglionic infarction (M4-M6 cortex): 3 Total score (0-10 with 10 being normal): 9 IMPRESSION: 1. Potentially acute perforator infarct at the left basal ganglia and corona radiata. 2. No acute hemorrhage 3. ASPECTS is 9 4. Motion artifact. Electronically Signed   By: Tiburcio Pea M.D.   On: 08/05/2021 07:28    Procedures .Critical Care Performed by: Terrilee Files, MD Authorized by: Terrilee Files, MD   Critical care provider  statement:    Critical care time (minutes):  90   Critical care time was exclusive of:  Separately billable procedures and treating other patients   Critical care was necessary to treat or prevent imminent or life-threatening deterioration of the following conditions:  CNS failure or compromise, circulatory failure, sepsis and shock   Critical care was time spent personally by me on the following activities:  Discussions with consultants, evaluation of patient's response to treatment, examination of patient, ordering and performing treatments and interventions, ordering and review of laboratory studies, ordering and review of radiographic studies, pulse oximetry, re-evaluation of patient's condition, obtaining history from patient or surrogate, review of old charts and development of treatment plan with patient or surrogate .Central Line  Date/Time: 08/05/2021 9:44 AM Performed by: Terrilee Files, MD Authorized by: Terrilee Files, MD   Consent:    Consent obtained:  Emergent situation Universal protocol:    Patient identity confirmed:  Arm band Pre-procedure details:    Indication(s): central venous access, hemodynamic monitoring and insufficient peripheral access     Hand hygiene: Hand hygiene performed prior to insertion     Sterile barrier technique: All elements of maximal sterile technique followed     Skin preparation:  Chlorhexidine   Skin preparation agent: Skin preparation agent completely dried prior to procedure   Sedation:    Sedation type:  None Anesthesia:    Anesthesia method:  None Procedure details:    Location:  R femoral   Patient position:  Supine   Procedural supplies:  Triple lumen   Ultrasound guidance: yes     Ultrasound guidance timing: prior to insertion     Sterile ultrasound techniques: Sterile gel and sterile probe covers were used     Number of attempts:  1 Post-procedure details:    Post-procedure:  Dressing applied and line sutured   Assessment:   Blood return through all ports and free fluid flow   Procedure completion:  Tolerated well, no immediate complications   Medications Ordered in ED Medications  lactated ringers infusion (has no administration in time range)  lactated ringers bolus 1,000 mL (has no administration in time range)    And  lactated ringers bolus 1,000 mL (has no administration in time range)  ceFEPIme (MAXIPIME) 2 g in sodium chloride 0.9 % 100 mL IVPB (has no administration in time range)  metroNIDAZOLE (FLAGYL) IVPB 500 mg (has no administration in time range)  vancomycin (VANCOREADY) IVPB 1250 mg/250 mL (has no administration in time range)  norepinephrine (LEVOPHED) 4-5 MG/250ML-% infusion SOLN (has no administration in time range)    ED Course  I have reviewed the triage vital signs and the nursing notes.  Pertinent labs &  imaging results that were available during my care of the patient were reviewed by me and considered in my medical decision making (see chart for details).  Clinical Course as of 08/05/21 1840  Fri Aug 05, 2021  [id number] Discussed with critical care who will evaluate the patient for admission.  Also reconsulted neurology Dr. Thomasena Edis.  He said that the patient does not need a CTA at this time and they will follow the patient.  Temperature came back at 106 degrees and Tylenol ordered.  Initial lactate 5.0.  Antibiotics and sepsis fluids infusing.  Levophed. [MB]    Clinical Course User Index [MB] Terrilee Files, MD   MDM Rules/Calculators/A&P                          This patient complains of altered mental status; this involves an extensive number of treatment Options and is a complaint that carries with it a high risk of complications and Morbidity. The differential includes sepsis, stroke, bleed, encephalopathy, metabolic derangement  I ordered, reviewed and interpreted labs, which included CBC with elevated white count normal hemoglobin, platelets low similar to priors,  chemistries with elevated BUN glucose, low bicarb and creatinine reflecting some dehydration, elevated LFTs troponins elevated need to be trended, COVID testing positive, urinalysis possible infection I ordered medication IV fluids IV antibiotics rectal Tylenol I ordered imaging studies which included chest x-ray and I independently    visualized and interpreted imaging which showed no acute infiltrates  Previous records obtained and reviewed in epic no recent admissions I consulted neurology Dr. Iver Nestle and Thomasena Edis, critical care and discussed lab and imaging findings  Critical Interventions: Evaluation and management of patient's sepsis with goal-directed fluids and antibiotics.  After the interventions stated above, I reevaluated the patient and found patient to be critically ill.  Remains tachypneic And altered.  Needs admission to the hospital for further management.  Condition guarded. Coyle Stordahl was evaluated in Emergency Department on 08/05/2021 for the symptoms described in the history of present illness. He was evaluated in the context of the global COVID-19 pandemic, which necessitated consideration that the patient might be at risk for infection with the SARS-CoV-2 virus that causes COVID-19. Institutional protocols and algorithms that pertain to the evaluation of patients at risk for COVID-19 are in a state of rapid change based on information released by regulatory bodies including the CDC and federal and state organizations. These policies and algorithms were followed during the patient's care in the ED.   Final Clinical Impression(s) / ED Diagnoses Final diagnoses:  Altered mental status, unspecified altered mental status type  Sepsis with encephalopathy without septic shock, due to unspecified organism [facility])    Rx / DC Orders ED Discharge Orders     None        Terrilee Files, MD 08/05/21 [place]

## 2021-08-05 NOTE — Progress Notes (Signed)
Dominic Benjamin, called. Updated on patient status and plan of care. Her new number is  [phone]

## 2021-08-05 NOTE — Consult Note (Signed)
Neurology Consultation Reason for Consult: Code stroke Requesting Physician: Meridee Score  CC: unresponsive patient with pinpoint pupils   History is obtained from: Chart review and bedside nurses  HPI: Dominic Benjamin is a 55 y.o. 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  He was initially discharged but then returned on 6/25 for confusion to [facility], had worsening weakness on 6/29 for which neurology was consulted.  MRI cervical spine revealed central cord signal intensity from C2-T1 and unfortunately had rapidly progressive weakness, ultimately requiring tracheostomy and PEG.  He additionally developed DVTs and was positive for SARS-CoV-2 on 7/10 for which anticoagulation was started and course was complicated by difficulties with pain management and anxiety.  Bedside nurse noted that at his new baseline he is able to follow some simple commands, interactive with the nurse, answer some yes/no questions and self suctions with the left hand.  He was last seen at this baseline around 4 AM  He has a known chronic left lacunar infarct with known right-sided spastic hemiparesis per neurology notes during his last admission  LKW: 4 AM tPA given?: No, INR 2.0 IA performed?: No, patient medically unstable and baseline modified Rankin scale outside of standard of care Premorbid modified rankin scale:      5 - Severe disability. Requires constant nursing care and attention, bedridden, incontinent.  ROS: All other review of systems was negative except as noted in the HPI  No past medical history on file. Please see pertinents in HPI  No  family history on file. Unable to obtain given mental status  Social History:  has no history on file for tobacco use, alcohol use, and drug use. Unable to confirm given mental status  Exam: Current vital signs: BP (!) 93/51   Pulse 99   Temp (!) 106 F (41.1 C) (Rectal)   Resp (!) 50   Ht 5\' 7"  (1.702 m)   Wt 65 kg   SpO2 97%   BMI 22.44 kg/m  Vital signs in last 24 hours: Temp:  [106 F (41.1 C)] 106 F (41.1 C) (09/16 0817) Pulse Rate:  [35-107] 99 (09/16 0845) Resp:  [23-50] 50 (09/16 0845) BP: (70-102)/(51-61) 93/51 (09/16 0845) SpO2:  [88 %-100 %] 97 % (09/16 0845) Weight:  [65 kg] 65 kg (09/16 0700)   Physical Exam  Constitutional: Appears cachectic and very chronically ill Psych: Minimally interactive Eyes: No scleral injection HENT: No oropharyngeal obstruction.  Tracheostomy in place MSK: no joint deformities.  Cardiovascular: Tachycardic appears to be in a regular rhythm on monitor Respiratory: Extremely labored breathing on tracheostomy GI: Soft.  No distension. Skin: Warm dry and intact visible skin  Neuro: Mental Status: Eyes open.  Not following commands, not tracking examiner Cranial Nerves: II: Visual Fields are no blink to threat on the right, perhaps preserved on the left III,IV, VI: Right gaze preference V: Facial sensation is symmetric to eyelash brush VII: Grimace to weak to characterize VIII: No clear response to voice Remainder unable to assess given mental status Motor: Diffusely severely cachectic.  Minimal movement in all 4 extremities Sensory: Minimal response to noxious stimulation Deep Tendon Reflexes: 2+ and symmetric in the biceps and patellae.  Cerebellar:  Unable to assess secondary to patient's mental status   NIHSS total 37 Completed at approximately 6:50 AM, while patient was in CT scanner   I have reviewed labs in epic and the results pertinent to this consultation are: Lactate 2.6, INR 2.0 Troponin greater than  500  I have reviewed the images obtained: Head CT without clear acute intracranial process, there is an age-indeterminate left basal ganglia hypodensity  Impression: Clinical impression is more consistent with sepsis and toxic/metabolic encephalopathy than acute stroke, although basilar occlusion could present with similar symptoms as described by bedside nursing.  Given therapeutic INR patient is not a candidate for TNKase, and given his baseline MRI as he is not a good candidate for thrombectomy nor is he clinically stable for this procedure.  Signed out to Dr. Charm Barges for medical stabilization as bedside team reports he is a full CODE STATUS per verbal signout from bedside nurses  Recommendations: -MRI brain and MRA head after patient is medically stabilized -Appreciate medical stabilization per ED  Brooke Dare MD-PhD [facility] [phone] Available 7 PM to 7 AM, outside of these hours please call Neurologist on call as listed on Amion.  Total critical care time: 35 minutes   Critical care time was exclusive of separately billable procedures and treating other patients.   Critical care was necessary to treat or prevent imminent or life-threatening deterioration.   Critical care was time spent personally by me on the following activities: development of treatment plan with patient and/or surrogate as well as nursing, discussions with consultants/primary team, evaluation of patient's response to treatment, examination of patient, obtaining history from patient or surrogate, ordering and performing treatments and interventions, ordering and review of laboratory studies, ordering and review of radiographic studies, and re-evaluation of patient's condition as needed, as documented above.

## 2021-08-05 NOTE — Consult Note (Signed)
WOC Nurse Consult Note: Patient receiving care in [facility] 3M13 Reason for Consult: unstageable sacral ulcer Wound type: Unstageable sacral PI Pressure Injury POA: Yes Measurement: To be obtained by bedside RN Wound bed: 100% black eschar  Drainage (amount, consistency, odor)  Periwound: red/pink border around wound Dressing procedure/placement/frequency: Will order hydrotherapy to be completed M-Sa with Santyl dressings. Bedside RN will change dressing on Sunday.  Monitor the wound area(s) for worsening of condition such as: Signs/symptoms of infection, increase in size, development of or worsening of odor, development of pain, or increased pain at the affected locations.   Notify the medical team if any of these develop.  Thank you for the consult. WOC nurse will follow weekly or PRN as needed by the medical staff or PT hydrotherapy team Please re-consult the WOC team if needed.  Renaldo Reel Katrinka Blazing, MSN, RN, CMSRN, Angus Seller, [facility] Wound Treatment Associate Pager [phone]

## 2021-08-05 NOTE — Procedures (Signed)
Patient Name: Dominic Benjamin  MRN: [id number]  Epilepsy Attending: Charlsie Quest  Referring Physician/Provider: Pia Mau, PA Date: 08/05/2021 Duration: 25.54 mins  Patient history: 54yo M with ams. EEG to evaluate for seizure  Level of alertness: lethargic   AEDs during EEG study: None  Technical aspects: This EEG study was done with scalp electrodes positioned according to the 10-20 International system of electrode placement. Electrical activity was acquired at a sampling rate of 500Hz  and reviewed with a high frequency filter of 70Hz  and a low frequency filter of 1Hz . EEG data were recorded continuously and digitally stored.   Description: EEG showed continuous generalized 3 to 5 Hz theta-delta slowing. Hyperventilation and photic stimulation were not performed.     ABNORMALITY - Continuous slow, generalized  IMPRESSION: This study is suggestive of moderate diffuse encephalopathy, nonspecific etiology. No seizures or epileptiform discharges were seen throughout the recording.     Dominic Benjamin 

## 2021-08-05 NOTE — ED Provider Notes (Signed)
   Ultrasound ED Peripheral IV (Provider)  Date/Time: 08/05/2021 7:53 AM Performed by: Alveria Apley, PA-C Authorized by: Alveria Apley, PA-C   Procedure details:    Indications: hypotension, multiple failed IV attempts and poor IV access     Skin Prep: chlorhexidine gluconate     Location:  Right AC   Angiocath:  18 G   Bedside Ultrasound Guided: Yes     Images: not archived     Patient tolerated procedure without complications: Yes     Dressing applied: Yes       Alveria Apley, PA-C 08/05/21 0754    Terrilee Files, MD 08/05/21 [place]

## 2021-08-05 NOTE — ED Notes (Signed)
Code stroke activated per RN Brittanys request

## 2021-08-06 ENCOUNTER — Inpatient Hospital Stay (HOSPITAL_COMMUNITY): Payer: Medicare Other

## 2021-08-06 DIAGNOSIS — Z7189 Other specified counseling: Secondary | ICD-10-CM

## 2021-08-06 DIAGNOSIS — G373 Acute transverse myelitis in demyelinating disease of central nervous system: Secondary | ICD-10-CM

## 2021-08-06 DIAGNOSIS — Z515 Encounter for palliative care: Secondary | ICD-10-CM

## 2021-08-06 DIAGNOSIS — R6521 Severe sepsis with septic shock: Secondary | ICD-10-CM | POA: Diagnosis not present

## 2021-08-06 DIAGNOSIS — E43 Unspecified severe protein-calorie malnutrition: Secondary | ICD-10-CM | POA: Diagnosis not present

## 2021-08-06 DIAGNOSIS — L8990 Pressure ulcer of unspecified site, unspecified stage: Secondary | ICD-10-CM | POA: Insufficient documentation

## 2021-08-06 DIAGNOSIS — L89150 Pressure ulcer of sacral region, unstageable: Secondary | ICD-10-CM | POA: Diagnosis not present

## 2021-08-06 DIAGNOSIS — I639 Cerebral infarction, unspecified: Secondary | ICD-10-CM | POA: Diagnosis not present

## 2021-08-06 DIAGNOSIS — A419 Sepsis, unspecified organism: Secondary | ICD-10-CM | POA: Diagnosis not present

## 2021-08-06 LAB — GLUCOSE, CAPILLARY
Glucose-Capillary: 314 mg/dL — ABNORMAL HIGH (ref 70–99)
Glucose-Capillary: 392 mg/dL — ABNORMAL HIGH (ref 70–99)
Glucose-Capillary: 395 mg/dL — ABNORMAL HIGH (ref 70–99)
Glucose-Capillary: 351 mg/dL — ABNORMAL HIGH (ref 70–99)
Glucose-Capillary: 318 mg/dL — ABNORMAL HIGH (ref 70–99)
Glucose-Capillary: 321 mg/dL — ABNORMAL HIGH (ref 70–99)
Glucose-Capillary: 327 mg/dL — ABNORMAL HIGH (ref 70–99)

## 2021-08-06 LAB — CBC
HCT: 39.4 % (ref 39.0–52.0)
Hemoglobin: 13 g/dL (ref 13.0–17.0)
MCH: 28.7 pg (ref 26.0–34.0)
MCHC: 33 g/dL (ref 30.0–36.0)
MCV: 87 fL (ref 80.0–100.0)
Platelets: 39 10*3/uL — ABNORMAL LOW (ref 150–400)
RBC: 4.53 MIL/uL (ref 4.22–5.81)
RDW: 19.4 % — ABNORMAL HIGH (ref 11.5–15.5)
WBC: 20.9 10*3/uL — ABNORMAL HIGH (ref 4.0–10.5)
nRBC: 0 % (ref 0.0–0.2)

## 2021-08-06 LAB — LACTIC ACID, PLASMA: Lactic Acid, Venous: 2.8 mmol/L (ref 0.5–1.9)

## 2021-08-06 LAB — COMPREHENSIVE METABOLIC PANEL
ALT: 65 U/L — ABNORMAL HIGH (ref 0–44)
AST: 31 U/L (ref 15–41)
Albumin: 2.4 g/dL — ABNORMAL LOW (ref 3.5–5.0)
Alkaline Phosphatase: 87 U/L (ref 38–126)
Anion gap: 10 (ref 5–15)
BUN: 39 mg/dL — ABNORMAL HIGH (ref 6–20)
CO2: 19 mmol/L — ABNORMAL LOW (ref 22–32)
Calcium: 8.4 mg/dL — ABNORMAL LOW (ref 8.9–10.3)
Chloride: 106 mmol/L (ref 98–111)
Creatinine, Ser: 0.43 mg/dL — ABNORMAL LOW (ref 0.61–1.24)
GFR, Estimated: >60 mL/min (ref >60)
Glucose, Bld: 378 mg/dL — ABNORMAL HIGH (ref 70–99)
Potassium: 2.9 mmol/L — ABNORMAL LOW (ref 3.5–5.1)
Sodium: 135 mmol/L (ref 135–145)
Total Bilirubin: 1 mg/dL (ref 0.3–1.2)
Total Protein: 4.4 g/dL — ABNORMAL LOW (ref 6.5–8.1)

## 2021-08-06 LAB — PROCALCITONIN: Procalcitonin: 0.89 ng/mL

## 2021-08-06 LAB — PROTIME-INR
INR: 2.5 — ABNORMAL HIGH (ref 0.8–1.2)
Prothrombin Time: 26.9 seconds — ABNORMAL HIGH (ref 11.4–15.2)

## 2021-08-06 LAB — URINE CULTURE: Culture: >=100000 — AB

## 2021-08-06 LAB — MAGNESIUM: Magnesium: 1.8 mg/dL (ref 1.7–2.4)

## 2021-08-06 MED ORDER — CHLORHEXIDINE GLUCONATE 0.12% ORAL RINSE (MEDLINE KIT)
15.0000 mL | Freq: Two times a day (BID) | OROMUCOSAL | Status: DC
  Administered 2021-08-06 – 2021-08-10 (×9): 15 mL via OROMUCOSAL

## 2021-08-06 MED ORDER — MIDODRINE HCL 5 MG PO TABS
10.0000 mg | ORAL_TABLET | Freq: Three times a day (TID) | ORAL | Status: DC
  Filled 2021-08-06: qty 2

## 2021-08-06 MED ORDER — ORAL CARE MOUTH RINSE
15.0000 mL | OROMUCOSAL | Status: DC
  Administered 2021-08-06 – 2021-08-10 (×41): 15 mL via OROMUCOSAL

## 2021-08-06 MED ORDER — CLOPIDOGREL BISULFATE 75 MG PO TABS: 75.0000 mg | ORAL_TABLET | Freq: Every day | ORAL | Status: DC

## 2021-08-06 MED ORDER — POTASSIUM CHLORIDE 10 MEQ/50ML IV SOLN
10.0000 meq | INTRAVENOUS | Status: AC
Start: 2021-08-06 — End: 2021-08-06
  Administered 2021-08-06 (×8): 10 meq via INTRAVENOUS
  Filled 2021-08-06 (×8): qty 50

## 2021-08-06 MED ORDER — MIDODRINE HCL 5 MG PO TABS: 10.0000 mg | ORAL_TABLET | Freq: Three times a day (TID) | ORAL | Status: DC

## 2021-08-06 MED ORDER — MAGNESIUM SULFATE 2 GM/50ML IV SOLN
2.0000 g | Freq: Once | INTRAVENOUS | Status: AC
  Administered 2021-08-06: 2 g via INTRAVENOUS
  Filled 2021-08-06: qty 50

## 2021-08-06 MED ORDER — ASPIRIN 81 MG PO CHEW
81.0000 mg | CHEWABLE_TABLET | Freq: Every day | ORAL | Status: DC
  Administered 2021-08-06: 81 mg
  Filled 2021-08-06: qty 1

## 2021-08-06 MED ORDER — SODIUM CHLORIDE 0.9% FLUSH: 10.0000 mL | INTRAVENOUS | Status: DC | PRN

## 2021-08-06 MED ORDER — INSULIN DETEMIR 100 UNIT/ML ~~LOC~~ SOLN
15.0000 [IU] | Freq: Two times a day (BID) | SUBCUTANEOUS | Status: DC
  Administered 2021-08-06 (×2): 15 [IU] via SUBCUTANEOUS
  Filled 2021-08-06 (×5): qty 0.15

## 2021-08-06 MED ORDER — SODIUM CHLORIDE 0.9% FLUSH
10.0000 mL | Freq: Two times a day (BID) | INTRAVENOUS | Status: DC
Start: 2021-08-06 — End: 2021-08-10
  Administered 2021-08-06 – 2021-08-10 (×7): 10 mL

## 2021-08-06 MED ORDER — LACTATED RINGERS IV BOLUS
1000.0000 mL | Freq: Once | INTRAVENOUS | Status: AC
  Administered 2021-08-06: 1000 mL via INTRAVENOUS

## 2021-08-06 MED ORDER — INSULIN DETEMIR 100 UNIT/ML ~~LOC~~ SOLN: 10.0000 [IU] | Freq: Two times a day (BID) | SUBCUTANEOUS | Status: DC

## 2021-08-06 MED ORDER — MIDODRINE HCL 5 MG PO TABS
10.0000 mg | ORAL_TABLET | Freq: Three times a day (TID) | ORAL | Status: DC
  Administered 2021-08-06 – 2021-08-07 (×3): 10 mg
  Filled 2021-08-06 (×2): qty 2

## 2021-08-06 NOTE — Plan of Care (Addendum)
Patient was a code stroke, but too unstable to continue workup. He was moved from CT suite to room for stablization and is under the care of PCCM for sepsis and other acute issues.   CTH showed ? acute stroke. We will repeat CTH this am to f/up. Patient is unable to have MRI brain. Recommendations to follow CTH results.   Jimmye Norman, MSN, APN-BC Neurology Nurse Practitioner Pager [phone]

## 2021-08-06 NOTE — Progress Notes (Signed)
[facility] ADULT ICU REPLACEMENT PROTOCOL   The patient does apply for the [facility] Adult ICU Electrolyte Replacment Protocol based on the criteria listed below:   1.Exclusion criteria: TCTS patients, ECMO patients and Hypothermia Protocol, and   Dialysis patients 2. Is GFR >/= 30 ml/min? Yes.    Patient's GFR today is >60 3. Is SCr </= 2? Yes.   Patient's SCr is 0.43 mg/dL 4. Did SCr increase >/= 0.5 in 24 hours? No. 5.Pt's weight >40kg  Yes.   6. Abnormal electrolyte(s):   K 2.9, Mg 1.8  7. Electrolytes replaced per protocol 8.  Call MD STAT for K+ </= 2.5, Phos </= 1, or Mag </= 1 Physician:  M. Dinkels  Dominic Benjamin 08/06/2021 5:56 AM

## 2021-08-06 NOTE — Progress Notes (Addendum)
NAME:  Dominic Benjamin, MRN:  [id number], DOB:  07/26/1966, LOS: 1 ADMISSION DATE:  08/05/2021, CONSULTATION DATE:  9/16 REFERRING MD:  Dr. 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  Patient last hospital admission on July 2022 w/ AKI, quadriparesis w/ transverse myelitis and resp failure. Patient was trached on June 23, 2021. Followed by Dr. Welton Flakes pulmonology for vent management.  Patient presents to [facility] ED on 9/16 w/ AMS and R sided weakness. CT Potentially acute perforator infarct at the left basal ganglia and corona radiata. Neuro consulted. Recommend MRI when stable. Trach on mechanical ventilation. Fever 106 F. WBC 15. UA w/ leukocytes and bacteria. CXR unremarkable. Started on cefepime and vanc. Lactate 5. Started on Levo. ABG 7.33, 39, 70, 21. K 5.4, glucose 245, BUN 68, Creat 0.60, ionized calcium 0.9. covid positive.  Significant Hospital Events: Including procedures, antibiotic start and stop dates in addition to other pertinent events   9/16: admitted R sided weakness, sepsis, and AMS; trached on vent  Interim History / Subjective:  Patient is more responsive today, continues to require low-dose vasopressors C. difficile stool toxin came back negative Tolerating pressure support at the moment  Objective   Blood pressure 100/68, pulse 74, temperature (!) 97.5 F (36.4 C), temperature source Bladder, resp. rate (!) 25, height 5\' 7"  (1.702 m), weight 58.7 kg, SpO2 100 %.    Vent Mode: PRVC FiO2 (%):  [40 %] 40 % Set Rate:  [24 bmp] 24 bmp Vt Set:  [400 mL] 400 mL PEEP:  [5 cmH20] 5 cmH20 Plateau Pressure:  [13  cmH20-14 cmH20] 13 cmH20   Intake/Output Summary (Last 24 hours) at 08/06/2021 0957 Last data filed at 08/06/2021 0800 Gross per 24 hour  Intake 4840.26 ml  Output 945 ml  Net 3895.26 ml   Filed Weights   08/05/21 0700 08/06/21 0500  Weight: 65 kg 58.7 kg    Examination: General:  critically ill appearing middle-aged Caucasian male, looks older than his stated age, lying on the bed HEENT: Atraumatic, normocephalic MM pink/moist; s/p trach Neuro: Opens eyes with vocal stimuli, tracking examiner, following simple commands.  Remains quadriplegic.  Gaze is central, aphasic CV: Regular rate and rhythm, no murmur appreciated PULM: Bilateral rhonchi, no wheezes or crackles GI: soft, bsx4 active; PEG tube in place without drainage or redness Extremities: warm/dry Skin: no rashes or lesions appreciated   Resolved Hospital Problem list     Assessment & Plan:  Acute left basal ganglia stroke Appreciated stroke team input Continue secondary stroke prophylaxis with aspirin and atorvastatin Patient is unable to get MRI A stroke team recommend repeating head CT today  Septic shock likely due to UTI/infected unstageable decubitus ulcer (POA) Lactic acidosis Demand cardiac ischemia Continue aggressive IV fluid resuscitation Patient's vasopressor requirement is coming down Wound care consult is requested Follow-up urine culture Continue broad-spectrum antibiotics Titrate serum troponins C. difficile and stool toxin came back negative, p.o. 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 w/ transverse myelitis Positive covid Continue lung protective ventilation Patient is tolerating  spontaneous breathing trial After half an hour we will try trach collar vap prevention in place Trach care protocol in place  Acute septic encephalopathy:  Patient  mental status has improved significantly He is following simple commands and tracking examiner Avoid sedation   Hypokalemia, hypocalcemia, hypomagnesemia Continue aggressive electrolyte supplement and monitor  Anxiety disorder Continue sertraline  Restless leg syndrome Holding ropinirole  Diabetes type 2 with hyperglycemia Patient does have well-controlled diabetes at baseline with hemoglobin A1c 6.2 Started on Levemir 15 units twice daily Continue sliding scale insulin  Unstageable decubitus ulcer on the sacrum (POA) Severe protein calorie malnutrition (POA) Wound care follow-up Dietitian follow-up  Best Practice (right click and "Reselect all SmartList Selections" daily)   Diet/type: Tube feed DVT prophylaxis: prophylactic heparin  GI prophylaxis: PPI Lines: Central line Foley: Yes, still needed Code Status:  full code Last date of multidisciplinary goals of care discussion [9/16.  Spoke with patient's brother, decision was to continue full aggressive care]  Labs   CBC: Recent Labs  Lab 08/05/21 0537 08/05/21 0737 08/05/21 0751 08/06/21 0736  WBC 12.5* 15.0*  --  20.9*  NEUTROABS  --  11.8*  --   --   HGB 15.4 15.9 17.0  17.3* 13.0  HCT 49.5 53.1* 50.0  51.0 39.4  MCV 89.4 93.8  --  87.0  PLT 81* 83*  --  39*    Basic Metabolic Panel: Recent Labs  Lab 08/05/21 0537 08/05/21 0737 08/05/21 0751 08/05/21 1443 08/06/21 0438  NA 136 137 135  135 134* 135  K 5.2* 5.4* 5.4*  5.4* 3.6 2.9*  CL 105 102 106 101 106  CO2 20* 20*  --  20* 19*  GLUCOSE 263* 244* 245* 284* 378*  BUN 64* 63* 68* 58* 39*  CREATININE 0.61 0.72 0.60* 0.55* 0.43*  CALCIUM 8.1* 8.4*  --  7.7* 8.4*  MG 2.0  --   --   --  1.8   GFR: Estimated Creatinine Clearance: 89.7 mL/min (A) (by C-G formula based on SCr of 0.43 mg/dL (L)). Recent Labs  Lab 08/05/21 0537 08/05/21 0737 08/05/21 0754 08/05/21 0952 08/05/21 1443 08/06/21 0438 08/06/21 0736  PROCALCITON  --   --   --   --   1.10 0.89  --   WBC 12.5* 15.0*  --   --   --   --  20.9*  LATICACIDVEN 2.6*  --  5.0* 4.2*  --   --   --     Liver Function Tests: Recent Labs  Lab 08/05/21 0537 08/05/21 0737 08/05/21 1443 08/06/21 0438  AST 76* 86* 59* 31  ALT 79* 91* 82* 65*  ALKPHOS 112 123 105 87  BILITOT 1.2 1.4* 1.4* 1.0  PROT 5.7* 6.0* 4.9* 4.4*  ALBUMIN 3.2* 3.4* 2.8* 2.4*   No results for input(s): LIPASE, AMYLASE in the last 168 hours. Recent Labs  Lab 08/05/21 0537  AMMONIA 44*    ABG    Component Value Date/Time   PHART 7.443 08/05/2021 0525   PCO2ART 29.5 (L) 08/05/2021 0525   PO2ART 134 (H) 08/05/2021 0525   HCO3 21.3 08/05/2021 0751   TCO2 21 (L) 08/05/2021 0751   TCO2 22 08/05/2021 0751   ACIDBASEDEF 4.0 (H) 08/05/2021 0751   O2SAT 93.0 08/05/2021 0751     Coagulation Profile: Recent Labs  Lab 08/03/21 0334 08/04/21 0452 08/05/21 0537 08/05/21 0737 08/06/21 0438  INR 2.1* 2.5* 2.0* 2.0* 2.5*    Cardiac Enzymes: Recent Labs  Lab 08/05/21  0537 08/05/21 1443  CKTOTAL 46* 85  CKMB 2.8  --     HbA1C: Hgb A1c MFr Bld  Date/Time Value Ref Range Status  08/05/2021 02:43 PM 6.2 (H) 4.8 - 5.6 % Final    Comment:    (NOTE) Pre diabetes:          5.7%-6.4%  Diabetes:              >6.4%  Glycemic control for   <7.0% adults with diabetes   07/10/2021 07:04 AM 6.2 (H) 4.8 - 5.6 % Final    Comment:    (NOTE) Pre diabetes:          5.7%-6.4%  Diabetes:              >6.4%  Glycemic control for   <7.0% adults with diabetes     CBG: Recent Labs  Lab 08/05/21 2006 08/05/21 2335 08/06/21 0316 08/06/21 0740 08/06/21 0743  GLUCAP 309* 259* 314* 395* 392*    Total critical care time: 43 minutes  Performed by: Cheri Fowler   Critical care time was exclusive of separately billable procedures and treating other patients.   Critical care was necessary to treat or prevent imminent or life-threatening deterioration.   Critical care was time spent personally by  me on the following activities: development of treatment plan with patient and/or surrogate as well as nursing, discussions with consultants, evaluation of patient's response to treatment, examination of patient, obtaining history from patient or surrogate, ordering and performing treatments and interventions, ordering and review of laboratory studies, ordering and review of radiographic studies, pulse oximetry and re-evaluation of patient's condition.   Cheri Fowler MD [facility] See Amion for pager If no response to pager, please call [phone] until 7pm After 7pm, Please call E-link [phone]

## 2021-08-06 NOTE — Consult Note (Addendum)
Consultation Note Date: 08/06/2021   Patient Name: Dominic Benjamin  DOB: 1966/03/01  MRN: [id number]  Age / Sex: 55 y.o., male  PCP: Hollie Beach Referring Physician: Jacky Kindle, MD  Reason for Consultation: Establishing goals of care "should be in hospice"  HPI/Patient Profile: 55 y.o. 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 He presented to the emergency department on 08/05/2021 with altered mental status and right-sided weakness. He was found to be in septic shock with high fevers, hypotension, and lactic acidosis. Started on vasopressor therapy. Source of sepsis likely UTI versus sacral wound.  CT head showed possible acute left basal ganglia stroke. Admitted to PCCM.   Clinical Assessment and Goals of Care: I have reviewed medical records at length: Patient was initially hospitalized at  [facility] 05/14/21 - 05/24/21 with acute renal failure. Several days after admission he started having lower extremity weakness. MRI 05/27/21 showed abnormal spinal cord signal concerning for transverse myelitis.  He was hospitalized at [facility] 05/25/21 - 07/01/21, where he was intubated and later underwent tracheostomy and PEG placement. He was transferred to [facility] on 07/01/21.   I went to see patient on 44M and received an update from his bedside RN. She reports patient is more responsive today compared to yesterday. He opens his eyes to voice, attempts to mouth words, and is moving his right arm. He remains on levophed at 1 mcg/min.  I spoke with brother Cecilie Lowers by phone  to discuss diagnosis, prognosis, [place], EOL wishes,  disposition, and options. Cecilie Lowers shares that he lives in [place] and is patient's [place].   I introduced Palliative Medicine as specialized medical care for people living with serious illness. It focuses on providing relief from the symptoms and stress of a serious illness.   We discussed a brief life review of the patient, who goes by his middle name "[name]". Cecilie Lowers states they are both from [place] originally. He describes [name] as "mentally challenged". He lived at home with his parents in their home in [place] up until they died. After their father died in January 13, 2017, [name] remained in the house as it was left to him in the will. Cecilie Lowers states his brother "had a hard time taking care of himself but was stubborn". Cecilie Lowers tried to get [name] to relocate to [place] to be closer to him, but he refused.    Patient was ambulatory and living independently in his home until he presented to the hospital (in [facility]) 05/14/21. Cecilie Lowers also states his brother "has a big heart" and was easy for people to take advantage of. Cecilie Lowers expresses concern that patient's significant other Sharyn Lull) moved in with him for a "free place to stay". Cecilie Lowers has never had any contact with Sharyn Lull.   We spent time reviewing his medical course over the past  few months. Discussed his current critical illness and what it means in the larger context of his ongoing co-morbidities. Discussed the natural disease trajectory of a sudden, severe neurologic condition such as transverse myelitis.  I provided education that transverse myelitis is a rare inflammatory disease causing injury to the spinal cord. Discussed that in Frank's case, the disease has left him with profound and permanent diabilities.   I attempted to elicit values and goals of care important to the patient. Cecilie Lowers reports he never had any conversations with his brother about whether he would want aggressive and life-prolonging measures. However, he states "I don't think he would want to live  like this". Patient did at one point state he would not want to live in a facility. He seemed to value remaining in his home.   The difference between aggressive medical intervention and comfort care was considered in light of the patient's goals of care.  I introduced the concept of a comfort path to [place], emphasizing that this path involves de-escalating and stopping full scope medical interventions, allowing a natural course to occur. Discussed that the goal is comfort and dignity rather than cure/prolonging life. Encouraged Cecilie Lowers to consider at what point they would want to stop full scope medical interventions, keeping in mind the concept of quality of life. Cecilie Lowers is open and receptive to the concept of comfort care.   Discussed that comfort care would involve removal of ventilator support. Provided education and couseling that by removing support, we are allowing a natural death to occur that would have happened earlier if the support had never been started.   We discussed code status. Encouraged Cecilie Lowers to consider DNR/DNI status understanding evidenced based poor outcomes in similar hospitalized patients, as the cause of the arrest is likely associated with chronic/terminal disease rather than a reversible acute cardio-pulmonary event.  I explained that DNR/DNI does not change the medical plan and it only comes into effect after a person has arrested (died).  It is a protective measure to keep [place] from harming the patient in their last moments of life. I expressed a recommendation for having a DNR order in place, to which [place] agrees.    Primary decision maker:  HCPOA Mahmud Keithly (brother): [phone], [phone]   SUMMARY OF RECOMMENDATIONS   Code status changed to DNR Continue full scope care for now Brother seemed open to the concept of comfort care PMT will follow-up tomorrow   Code Status/Advance Care Planning: DNR  Palliative Prophylaxis:  Oral Care and Turn  Reposition  Additional Recommendations (Limitations, Scope, Preferences): Full Scope Treatment  Psycho-social/Spiritual:  Created space and opportunity for family to express thoughts and feelings regarding patient's current medical situation.  Emotional support provided   Prognosis:  Overall poor   Discharge Planning: To Be Determined      Primary Diagnoses: Present on Admission:  Septic shock ([place])  Stroke ([place])   I have reviewed the medical record, interviewed the patient and family, and examined the patient. The following aspects are pertinent.  History reviewed. No pertinent past medical history.   History reviewed. No pertinent family history. Scheduled Meds:  aspirin  81 mg Per Tube Daily   atorvastatin  40 mg Per Tube QHS   chlorhexidine gluconate (MEDLINE KIT)  15 mL Mouth Rinse BID   Chlorhexidine Gluconate Cloth  6 each Topical Daily   collagenase   Topical Daily   feeding supplement (PROSource TF)  45 mL Per Tube TID   insulin aspart  0-9  Units Subcutaneous Q4H   insulin detemir  15 Units Subcutaneous BID   lactulose  20 g Per Tube Once   mouth rinse  15 mL Mouth Rinse 10 times per day   midodrine  10 mg Oral TID WC   pantoprazole sodium  40 mg Per Tube Daily   sodium chloride flush  10-40 mL Intracatheter Q12H   Continuous Infusions:  sodium chloride Stopped (08/06/21 0934)   ceFEPime (MAXIPIME) IV Stopped (08/06/21 0846)   feeding supplement (VITAL 1.5 CAL) 1,000 mL (08/05/21 2225)   fentaNYL infusion INTRAVENOUS Stopped (08/06/21 0730)   norepinephrine (LEVOPHED) Adult infusion 1 mcg/min (08/06/21 1000)   potassium chloride 50 mL/hr at 08/06/21 1000   PRN Meds:.sodium chloride, acetaminophen (TYLENOL) oral liquid 160 mg/5 mL, docusate, fentaNYL, ipratropium-albuterol, polyethylene glycol, sodium chloride flush   No Known Allergies Review of Systems  Unable to perform ROS: Patient nonverbal   Physical Exam Vitals reviewed.  Constitutional:       General: He is not in acute distress.    Appearance: He is ill-appearing.  Pulmonary:     Comments: Trach/vent Abdominal:     Comments: PEG  Skin:    Comments: Sacral wound  Neurological:     Comments: Moves right upper extremity only    Vital Signs: BP 100/68 (BP Location: Right Arm)   Pulse 70   Temp (!) 97.5 F (36.4 C) (Bladder)   Resp 20   Ht _0  (1.702 m)   Wt 58.7 kg   SpO2 100%   BMI 20.27 kg/m  Pain Scale: CPOT      SpO2: SpO2: 100 % O2 Device:SpO2: 100 % O2 Flow Rate: .   IO: Intake/output summary:  Intake/Output Summary (Last 24 hours) at 08/06/2021 1054 Last data filed at 08/06/2021 1000 Gross per 24 hour  Intake 2889.47 ml  Output 945 ml  Net 1944.47 ml    LBM: Last BM Date: 08/06/21 Baseline Weight: Weight: 65 kg Most recent weight: Weight: 58.7 kg      Palliative Assessment/Data: PPS 30%     Time In: 1245 Time Out: 1430 Time Total: 105 Greater than 50%  of this time was spent counseling and coordinating care related to the above assessment and plan.  Signed by: Lavena Bullion, NP   Please contact Palliative Medicine Team phone at [phone] for questions and concerns.  For individual provider: See Shea Evans

## 2021-08-06 NOTE — Progress Notes (Signed)
Patient transported to CT and back without complications. RN at bedside.  

## 2021-08-06 NOTE — Plan of Care (Signed)
F/up CTH on 08/06/21.   Unchanged, potentially acute left basal ganglia infarct. 2.   No evidence of new intracranial abnormality.   Patient platelet count is 39,000 so will not start Plavix at this time. Continue ASA and statin. 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 Needs echocardiogram when able/stable. Check fasting lipid panel and increase statin dose if LDL > 70. HbA1c is in goal range.    We will have stroke team f/up tomorrow.   Jimmye Norman, MSN, APN-BC Neurology Nurse Practitioner

## 2021-08-06 NOTE — Progress Notes (Signed)
Physical Therapy Wound Evaluation and Treatment Patient Details  Name: Dominic Benjamin MRN: [id number] Date of Birth: [birth year]  Today's Date: 08/06/2021 Time: 9622-2979 Time Calculation (min): 41 min  Subjective  Subjective Assessment Subjective: nothing--with trach and lethargic Patient and Family Stated Goals: pt unable to participate due to lethargy Date of Onset:  (present on admission) Prior Treatments: unknown  Pain Score:   None noted  Wound Assessment  Pressure Injury 08/05/21 Coccyx Medial Unstageable - Full thickness tissue loss in which the base of the injury is covered by slough (yellow, tan, gray, green or brown) and/or eschar (tan, brown or black) in the wound bed. eschar (Active)  Wound Image   08/06/21 0936  Dressing Type Gauze (Comment);Moist to moist;Santyl;Silicone dressing 89/21/19 0936  Dressing Changed 08/06/21 0936  Dressing Change Frequency Daily 08/06/21 0936  State of Healing Eschar 08/06/21 0936  Site / Wound Assessment Black;Granulation tissue;Yellow;Red 08/06/21 0936  % Wound base Red or Granulating 70% 08/06/21 0936  % Wound base Yellow/Fibrinous Exudate 20% 08/06/21 0936  % Wound base Black/Eschar 10% 08/06/21 0936  % Wound base Other/Granulation Tissue (Comment) 10% 08/06/21 0400  Peri-wound Assessment Erythema (non-blanchable) 08/06/21 0936  Wound Length (cm) 9 cm 08/06/21 0936  Wound Width (cm) 7.9 cm 08/06/21 0936  Wound Depth (cm) 0.8 cm 08/06/21 0936  Wound Surface Area (cm^2) 71.1 cm^2 08/06/21 0936  Wound Volume (cm^3) 56.88 cm^3 08/06/21 0936  Margins Unattached edges (unapproximated) 08/06/21 0936  Drainage Amount Scant 08/06/21 0936  Drainage Description Serosanguineous 08/06/21 0936  Treatment Debridement (Selective);Hydrotherapy (Pulse lavage) 08/06/21 0936      Hydrotherapy Pulsed lavage therapy - wound location: sacrum Pulsed Lavage with Suction (psi):  (4-12) Pulsed Lavage with Suction - Normal Saline Used: 1000 mL Pulsed  Lavage Tip: Tip with splash shield Selective Debridement Selective Debridement - Location: sacrum Selective Debridement - Tools Used: Forceps, Scalpel Selective Debridement - Tissue Removed: black eschar    Wound Assessment and Plan  Wound Therapy - Assess/Plan/Recommendations Wound Therapy - Clinical Statement: Patient admitted with sacral wound which is non-stageable due to significant amount of black eschar. Will benefit from hydrotherapy to remove eschar, reduce bioburden, and promote healing. Wound Therapy - Functional Problem List: limited turning/positioning surfaces Factors Delaying/Impairing Wound Healing: Altered sensation, Incontinence, Infection - systemic/local, Immobility, Polypharmacy Hydrotherapy Plan: Debridement, Dressing change, Patient/family education, Pulsatile lavage with suction Wound Therapy - Frequency: 6X / week Wound Therapy - Follow Up Recommendations: f/u selective debridement, dressing changes by RN  Wound Therapy Goals- Improve the function of patient's integumentary system by progressing the wound(s) through the phases of wound healing (inflammation - proliferation - remodeling) by: Wound Therapy Goals - Improve the function of patient's integumentary system by progressing the wound(s) through the phases of wound healing by: Decrease Necrotic Tissue to: 60 Decrease Necrotic Tissue - Progress: Goal set today Increase Granulation Tissue to: 40 Increase Granulation Tissue - Progress: Goal set today Decrease Length/Width/Depth by (cm): 0.3/0.3/NA will likely increase depth Decrease Length/Width/Depth - Progress: Goal set today Goals/treatment plan/discharge plan were made with and agreed upon by patient/family: No, Patient unable to participate in goals/treatment/discharge plan and family unavailable Time For Goal Achievement: 2 weeks Wound Therapy - Potential for Goals: Fair  Goals will be updated until maximal potential achieved or discharge criteria met.   Discharge criteria: when goals achieved, discharge from hospital, MD decision/surgical intervention, no progress towards goals, refusal/missing three consecutive treatments without notification or medical reason.  GP     Charges PT Wound Care  Charges $Wound Debridement up to 20 cm: < or equal to 20 cm $ Wound Debridement each add'l 20 sqcm: 3 $PT PLS Gun and Tip: 1 Supply $PT Hydrotherapy Visit: 1 Visit      Arby Barrette, PT Pager [phone]   Rexanne Mano 08/06/2021, 9:48 AM

## 2021-08-07 ENCOUNTER — Inpatient Hospital Stay (HOSPITAL_COMMUNITY): Payer: Medicare Other

## 2021-08-07 DIAGNOSIS — D696 Thrombocytopenia, unspecified: Secondary | ICD-10-CM

## 2021-08-07 DIAGNOSIS — G9340 Encephalopathy, unspecified: Secondary | ICD-10-CM

## 2021-08-07 DIAGNOSIS — Z86718 Personal history of other venous thrombosis and embolism: Secondary | ICD-10-CM

## 2021-08-07 DIAGNOSIS — A419 Sepsis, unspecified organism: Secondary | ICD-10-CM | POA: Diagnosis not present

## 2021-08-07 DIAGNOSIS — I639 Cerebral infarction, unspecified: Secondary | ICD-10-CM | POA: Diagnosis not present

## 2021-08-07 DIAGNOSIS — L89150 Pressure ulcer of sacral region, unstageable: Secondary | ICD-10-CM | POA: Diagnosis not present

## 2021-08-07 DIAGNOSIS — G373 Acute transverse myelitis in demyelinating disease of central nervous system: Secondary | ICD-10-CM | POA: Diagnosis not present

## 2021-08-07 DIAGNOSIS — R6521 Severe sepsis with septic shock: Secondary | ICD-10-CM | POA: Diagnosis not present

## 2021-08-07 DIAGNOSIS — R6520 Severe sepsis without septic shock: Secondary | ICD-10-CM

## 2021-08-07 DIAGNOSIS — E43 Unspecified severe protein-calorie malnutrition: Secondary | ICD-10-CM | POA: Diagnosis not present

## 2021-08-07 DIAGNOSIS — Z8673 Personal history of transient ischemic attack (TIA), and cerebral infarction without residual deficits: Secondary | ICD-10-CM | POA: Diagnosis not present

## 2021-08-07 DIAGNOSIS — R4182 Altered mental status, unspecified: Secondary | ICD-10-CM | POA: Diagnosis not present

## 2021-08-07 LAB — CBC
HCT: 33.4 % — ABNORMAL LOW (ref 39.0–52.0)
Hemoglobin: 10.4 g/dL — ABNORMAL LOW (ref 13.0–17.0)
MCH: 28 pg (ref 26.0–34.0)
MCHC: 31.1 g/dL (ref 30.0–36.0)
MCV: 90 fL (ref 80.0–100.0)
Platelets: 29 10*3/uL — CL (ref 150–400)
RBC: 3.71 MIL/uL — ABNORMAL LOW (ref 4.22–5.81)
RDW: 19.9 % — ABNORMAL HIGH (ref 11.5–15.5)
WBC: 15.2 10*3/uL — ABNORMAL HIGH (ref 4.0–10.5)
nRBC: 0 % (ref 0.0–0.2)

## 2021-08-07 LAB — MAGNESIUM
Magnesium: 2 mg/dL (ref 1.7–2.4)
Magnesium: 1.7 mg/dL (ref 1.7–2.4)

## 2021-08-07 LAB — BASIC METABOLIC PANEL
Anion gap: 5 (ref 5–15)
Anion gap: 4 — ABNORMAL LOW (ref 5–15)
BUN: 32 mg/dL — ABNORMAL HIGH (ref 6–20)
BUN: 28 mg/dL — ABNORMAL HIGH (ref 6–20)
CO2: 19 mmol/L — ABNORMAL LOW (ref 22–32)
CO2: 23 mmol/L (ref 22–32)
Calcium: 8.1 mg/dL — ABNORMAL LOW (ref 8.9–10.3)
Calcium: 8.2 mg/dL — ABNORMAL LOW (ref 8.9–10.3)
Chloride: 111 mmol/L (ref 98–111)
Chloride: 113 mmol/L — ABNORMAL HIGH (ref 98–111)
Creatinine, Ser: 0.31 mg/dL — ABNORMAL LOW (ref 0.61–1.24)
Creatinine, Ser: <0.3 mg/dL — ABNORMAL LOW (ref 0.61–1.24)
GFR, Estimated: >60 mL/min (ref >60)
Glucose, Bld: 345 mg/dL — ABNORMAL HIGH (ref 70–99)
Glucose, Bld: 204 mg/dL — ABNORMAL HIGH (ref 70–99)
Potassium: 4.1 mmol/L (ref 3.5–5.1)
Potassium: 4.3 mmol/L (ref 3.5–5.1)
Sodium: 135 mmol/L (ref 135–145)
Sodium: 140 mmol/L (ref 135–145)

## 2021-08-07 LAB — GLUCOSE, CAPILLARY
Glucose-Capillary: 333 mg/dL — ABNORMAL HIGH (ref 70–99)
Glucose-Capillary: 299 mg/dL — ABNORMAL HIGH (ref 70–99)
Glucose-Capillary: 283 mg/dL — ABNORMAL HIGH (ref 70–99)
Glucose-Capillary: 224 mg/dL — ABNORMAL HIGH (ref 70–99)
Glucose-Capillary: 166 mg/dL — ABNORMAL HIGH (ref 70–99)

## 2021-08-07 LAB — PHOSPHORUS
Phosphorus: 1.3 mg/dL — ABNORMAL LOW (ref 2.5–4.6)
Phosphorus: 1.9 mg/dL — ABNORMAL LOW (ref 2.5–4.6)

## 2021-08-07 LAB — PROTIME-INR
INR: 2.2 — ABNORMAL HIGH (ref 0.8–1.2)
Prothrombin Time: 24.6 seconds — ABNORMAL HIGH (ref 11.4–15.2)

## 2021-08-07 LAB — PROCALCITONIN: Procalcitonin: 0.55 ng/mL

## 2021-08-07 LAB — CALCIUM, IONIZED: Calcium, Ionized, Serum: 4.3 mg/dL — ABNORMAL LOW (ref 4.5–5.6)

## 2021-08-07 MED ORDER — POTASSIUM & SODIUM PHOSPHATES 280-160-250 MG PO PACK
1.0000 | PACK | Freq: Three times a day (TID) | ORAL | Status: DC
  Administered 2021-08-07 – 2021-08-10 (×13): 1
  Filled 2021-08-07 (×13): qty 1

## 2021-08-07 MED ORDER — FAMOTIDINE 40 MG/5ML PO SUSR
20.0000 mg | Freq: Two times a day (BID) | ORAL | Status: DC
  Administered 2021-08-07 – 2021-08-10 (×7): 20 mg
  Filled 2021-08-07 (×8): qty 2.5

## 2021-08-07 MED ORDER — INSULIN DETEMIR 100 UNIT/ML ~~LOC~~ SOLN
20.0000 [IU] | Freq: Two times a day (BID) | SUBCUTANEOUS | Status: DC
  Administered 2021-08-07 – 2021-08-10 (×7): 20 [IU] via SUBCUTANEOUS
  Filled 2021-08-07 (×8): qty 0.2

## 2021-08-07 MED ORDER — INSULIN ASPART 100 UNIT/ML IJ SOLN
5.0000 [IU] | INTRAMUSCULAR | Status: DC
  Administered 2021-08-07 – 2021-08-10 (×17): 5 [IU] via SUBCUTANEOUS

## 2021-08-07 MED ORDER — OXYCODONE HCL 5 MG PO TABS
2.5000 mg | ORAL_TABLET | Freq: Four times a day (QID) | ORAL | Status: DC | PRN
Start: 2021-08-07 — End: 2021-08-10
  Administered 2021-08-07 – 2021-08-09 (×4): 2.5 mg
  Filled 2021-08-07 (×5): qty 1

## 2021-08-07 MED ORDER — MIDODRINE HCL 5 MG PO TABS
20.0000 mg | ORAL_TABLET | Freq: Three times a day (TID) | ORAL | Status: DC
  Administered 2021-08-07 – 2021-08-09 (×7): 20 mg
  Filled 2021-08-07 (×7): qty 4

## 2021-08-07 MED ORDER — INSULIN ASPART 100 UNIT/ML IJ SOLN
0.0000 [IU] | INTRAMUSCULAR | Status: DC
  Administered 2021-08-07: 3 [IU] via SUBCUTANEOUS
  Administered 2021-08-07: 8 [IU] via SUBCUTANEOUS
  Administered 2021-08-07: 5 [IU] via SUBCUTANEOUS
  Administered 2021-08-08 (×2): 2 [IU] via SUBCUTANEOUS
  Administered 2021-08-08: 3 [IU] via SUBCUTANEOUS
  Administered 2021-08-08 – 2021-08-09 (×2): 2 [IU] via SUBCUTANEOUS
  Administered 2021-08-09: 3 [IU] via SUBCUTANEOUS
  Administered 2021-08-09 (×2): 2 [IU] via SUBCUTANEOUS
  Administered 2021-08-10: 5 [IU] via SUBCUTANEOUS
  Administered 2021-08-10 (×3): 3 [IU] via SUBCUTANEOUS

## 2021-08-07 NOTE — Progress Notes (Signed)
RT placed patient on ventilator at 2230 and transported pt with RN x 2 to MRI and back with no complications. Patient remains on ventilator at this time. RT will continue to monitor.

## 2021-08-07 NOTE — Progress Notes (Signed)
STROKE TEAM PROGRESS NOTE   SUBJECTIVE (INTERVAL HISTORY) His RN is at the bedside.  Patient eyes open, able to follow midline commands, on trach collar now, seems tolerating okay.  Slight movement of right hand, but flaccid left upper extremity, contracture at the right elbow, with paraplegia.  CT x2 showed left BG infarct.  Of note, patient had a stroke in 05/2011, MRI showed left BG infarct at that time.  However, current CT did not show abnormality to account for left arm weakness.  Per RN, family is considering comfort care at this time.   OBJECTIVE Temp:  [97.2 F (36.2 C)-99.7 F (37.6 C)] 98.6 F (37 C) (09/18 0700) Pulse Rate:  [48-76] 63 (09/18 1400) Cardiac Rhythm: Normal sinus rhythm (09/18 1146) Resp:  [15-31] 31 (09/18 1400) BP: (87-164)/(44-90) 122/63 (09/18 1400) SpO2:  [89 %-100 %] 98 % (09/18 1400) Arterial Line BP: (93-177)/(43-82) 177/82 (09/18 0800) FiO2 (%):  [30 %-40 %] 35 % (09/18 1145) Weight:  [58.9 kg] 58.9 kg (09/18 0500)  Recent Labs  Lab 08/06/21 2314 08/07/21 0320 08/07/21 0736 08/07/21 1115 08/07/21 1601  GLUCAP 327* 333* 299* 283* 224*   Recent Labs  Lab 08/05/21 0537 08/05/21 0737 08/05/21 0751 08/05/21 1443 08/06/21 0438 08/07/21 0321  NA 136 137 135  135 134* 135 135  K 5.2* 5.4* 5.4*  5.4* 3.6 2.9* 4.1  CL 105 102 106 101 106 111  CO2 20* 20*  --  20* 19* 19*  GLUCOSE 263* 244* 245* 284* 378* 345*  BUN 64* 63* 68* 58* 39* 32*  CREATININE 0.61 0.72 0.60* 0.55* 0.43* 0.31*  CALCIUM 8.1* 8.4*  --  7.7* 8.4* 8.1*  MG 2.0  --   --   --  1.8 2.0  PHOS  --   --   --   --   --  1.3*   Recent Labs  Lab 08/05/21 0537 08/05/21 0737 08/05/21 1443 08/06/21 0438  AST 76* 86* 59* 31  ALT 79* 91* 82* 65*  ALKPHOS 112 123 105 87  BILITOT 1.2 1.4* 1.4* 1.0  PROT 5.7* 6.0* 4.9* 4.4*  ALBUMIN 3.2* 3.4* 2.8* 2.4*   Recent Labs  Lab 08/05/21 0537 08/05/21 0737 08/05/21 0751 08/06/21 0736 08/07/21 0321  WBC 12.5* 15.0*  --  20.9*  15.2*  NEUTROABS  --  11.8*  --   --   --   HGB 15.4 15.9 17.0  17.3* 13.0 10.4*  HCT 49.5 53.1* 50.0  51.0 39.4 33.4*  MCV 89.4 93.8  --  87.0 90.0  PLT 81* 83*  --  39* 29*   Recent Labs  Lab 08/05/21 0537 08/05/21 1443  CKTOTAL 46* 85  CKMB 2.8  --    Recent Labs    08/05/21 0537 08/05/21 0737 08/06/21 0438 08/07/21 0321  LABPROT 22.5* 22.4* 26.9* 24.6*  INR 2.0* 2.0* 2.5* 2.2*   Recent Labs    08/05/21 0737  COLORURINE AMBER*  LABSPEC 1.025  PHURINE 5.0  GLUCOSEU NEGATIVE  HGBUR LARGE*  BILIRUBINUR NEGATIVE  KETONESUR NEGATIVE  PROTEINUR >=300*  NITRITE NEGATIVE  LEUKOCYTESUR MODERATE*    No results found for: CHOL, TRIG, HDL, CHOLHDL, VLDL, LDLCALC Lab Results  Component Value Date   HGBA1C 6.2 (H) 08/05/2021   No results found for: LABOPIA, COCAINSCRNUR, LABBENZ, AMPHETMU, THCU, LABBARB  No results for input(s): ETH in the last 168 hours.  I have personally reviewed the radiological images below and agree with the radiology interpretations.  CT HEAD WO CONTRAST (  )  Result Date: 08/06/2021 CLINICAL DATA:  Stroke follow-up. EXAM: CT HEAD WITHOUT CONTRAST TECHNIQUE: Contiguous axial images were obtained from the base of the skull through the vertex without intravenous contrast. COMPARISON:  08/05/2021 FINDINGS: Brain: A small band like region of hypodensity in the left basal ganglia is unchanged and consistent with an age indeterminate but potentially acute lateral lenticulostriate territory infarct. No new infarct, intracranial hemorrhage, mass, midline shift, or extra-axial fluid collection is identified. The ventricles are normal in size. Vascular: Calcified atherosclerosis at the skull base. No hyperdense vessel. Skull: No fracture or suspicious osseous lesion. Sinuses/Orbits: Visualized paranasal sinuses and mastoid air cells are clear. Unremarkable included orbits. Other: None. IMPRESSION: 1. Unchanged, potentially acute left basal ganglia infarct. 2. No  evidence of new intracranial abnormality. Electronically Signed   By: Sebastian Ache M.D.   On: 08/06/2021 15:00   DG CHEST PORT 1 VIEW  Result Date: 08/05/2021 CLINICAL DATA:  Central line placement EXAM: PORTABLE CHEST - 1 VIEW COMPARISON:  Earlier film of the same day FINDINGS: Left subclavian central venous catheter extends to the proximal SVC. No pneumothorax. Lungs are clear. The right apex is partially excluded. Tracheostomy device stable in position. Heart size and mediastinal contours are within normal limits. No effusion. Vertebral endplate spurring at multiple levels in the mid and lower thoracic spine. IMPRESSION: 1. Central line to  the proximal SVC without pneumothorax. Electronically Signed   By: Corlis Leak M.D.   On: 08/05/2021 17:04   DG Chest Port 1 View  Result Date: 08/05/2021 CLINICAL DATA:  Questionable sepsis EXAM: PORTABLE CHEST 1 VIEW COMPARISON:  Yesterday FINDINGS: Tracheostomy tube in place. There is no edema, consolidation, effusion, or pneumothorax. Normal heart size and mediastinal contours. Less well demonstrated pneumomediastinum due to rotation and possible decreased. IMPRESSION: Stable chest. Electronically Signed   By: Tiburcio Pea M.D.   On: 08/05/2021 08:31   DG CHEST PORT 1 VIEW  Result Date: 08/04/2021 CLINICAL DATA:  Fever EXAM: PORTABLE CHEST 1 VIEW COMPARISON:  07/27/2021 FINDINGS: Persistent small pneumomediastinum. No residual pneumothorax. Unchanged position of tracheostomy tube. Lungs are clear. No pleural effusion. IMPRESSION: Persistent pneumomediastinum. No residual pneumothorax. Electronically Signed   By: Deatra Robinson M.D.   On: 08/04/2021 23:24   DG Chest Port 1 View  Result Date: 07/27/2021 CLINICAL DATA:  55 year old male with mild pneumothorax and pneumomediastinum. Respiratory failure, sepsis, transverse myelitis. EXAM: PORTABLE CHEST 1 VIEW COMPARISON:  Portable chest 07/26/2021 and earlier. FINDINGS: Portable AP semi upright view at 0604  hours. The patient is now rotated to the right. Tracheostomy and right PICC line are stable. Stable cardiac size and mediastinal contours. Stable lung volumes. Small volume right chest wall subcutaneous gas is stable. Streaky left supraclavicular subcutaneous gas also appears stable. Small volume pneumomediastinum appears stable. Trace apical pneumothoraces were better demonstrated yesterday but stable. No new pulmonary opacity.  Stable visualized osseous structures. IMPRESSION: 1.  Stable lines and tubes.  Stable ventilation. 2. Stable trace bilateral pneumothoraces, small volume pneumomediastinum, and small volume bilateral chest wall and neck subcutaneous emphysema. Electronically Signed   By: Odessa Fleming M.D.   On: 07/27/2021 06:29   DG CHEST PORT 1 VIEW  Result Date: 07/26/2021 CLINICAL DATA:  PICC placement. EXAM: PORTABLE CHEST 1 VIEW COMPARISON:  Same day. FINDINGS: Stable cardiomediastinal silhouette. Tracheostomy tube is unchanged in position. Interval placement of right-sided PICC line with distal tip in expected position of the SVC. Stable bilateral minimal apical pneumothoraces. Stable pneumomediastinum is noted. Subcutaneous  emphysema is seen over right lateral chest wall as well as in both supraclavicular regions. Bony thorax is unremarkable. IMPRESSION: Interval placement of right-sided PICC line with distal tip in expected position of the SVC. Stable minimal bilateral apical pneumothoraces and pneumomediastinum is noted. Electronically Signed   By: Lupita Raider M.D.   On: 07/26/2021 14:51   DG Chest Port 1 View  Result Date: 07/26/2021 CLINICAL DATA:  Pneumonia. EXAM: PORTABLE CHEST 1 VIEW COMPARISON:  Chest x-ray dated July 23, 2021. FINDINGS: Unchanged tracheostomy tube. Normal heart size. Slightly increased pneumomediastinum. New trace bilateral apical pneumothoraces, not clearly seen on the prior study. Resolved airspace disease in the right lung. No pleural effusion. No acute osseous  abnormality. IMPRESSION: 1. Slightly increased pneumomediastinum with new trace bilateral apical pneumothoraces. 2. Resolved right lung pneumonia. Electronically Signed   By: Obie Dredge M.D.   On: 07/26/2021 06:18   DG Chest Port 1 View  Result Date: 07/23/2021 CLINICAL DATA:  Respiratory failure EXAM: PORTABLE CHEST 1 VIEW COMPARISON:  07/20/2021 FINDINGS: Tracheostomy is unchanged. New airspace disease throughout the right lung, most confluent in the right upper lobe. Medial left basilar atelectasis. No effusions. No acute bony abnormality. Heart is normal size. IMPRESSION: Airspace disease throughout the right lung, most confluent in the right upper lobe concerning for pneumonia. Medial basilar atelectasis on the left. Electronically Signed   By: Charlett Nose M.D.   On: 07/23/2021 01:35   DG CHEST PORT 1 VIEW  Result Date: 07/20/2021 CLINICAL DATA:  Respiratory failure (HCC) J96.90 (ICD-10-CM) EXAM: PORTABLE CHEST 1 VIEW COMPARISON:  07/01/2021. FINDINGS: New airspace opacities in the lateral left lung base. Tracheostomy tube tip projects near the midline at the thoracic inlet with slight patient rotation and lordotic positioning. No visible pleural effusion or pneumothorax on this single semi erect radiograph. Cardiomediastinal silhouette is within normal limits and similar to prior. IMPRESSION: New airspace opacities in the lateral left lung base, concerning for aspiration and/or pneumonia. Recommend follow-up imaging to resolution. Electronically Signed   By: Feliberto Harts M.D.   On: 07/20/2021 09:20   DG Chest Port 1 View  Result Date: 07/11/2021 CLINICAL DATA:  Elevated white blood cell count EXAM: PORTABLE CHEST 1 VIEW COMPARISON:  07/01/2021 FINDINGS: Frontal view of the chest was obtained. The patient is markedly rotated to the left at the time of imaging, severely limited the evaluation. Please note that the image is mislabeled by the technologist, as previous exams have demonstrated  an elevated left hemidiaphragm. The cardiac silhouette is grossly unremarkable. No airspace disease, effusion, or pneumothorax. No acute bony abnormality. Tracheostomy tube unchanged. IMPRESSION: 1. Severely limited evaluation due to patient positioning. 2. No gross airspace disease. Electronically Signed   By: Sharlet Salina M.D.   On: 07/11/2021 17:14   EEG adult  Result Date: 08/05/2021 Charlsie Quest, MD     08/05/2021 10:51 PM Patient Name: Dominic Benjamin MRN: [id number] Epilepsy Attending: Charlsie Quest Referring Physician/Provider: Pia Mau, PA Date: 08/05/2021 Duration: 25.54 mins Patient history: 54yo M with ams. EEG to evaluate for seizure Level of alertness: lethargic AEDs during EEG study: None Technical aspects: This EEG study was done with scalp electrodes positioned according to the 10-20 International system of electrode placement. Electrical activity was acquired at a sampling rate of 500Hz  and reviewed with a high frequency filter of 70Hz  and a low frequency filter of 1Hz . EEG data were recorded continuously and digitally stored. Description: EEG showed continuous generalized 3 to 5  Hz theta-delta slowing. Hyperventilation and photic stimulation were not performed.   ABNORMALITY - Continuous slow, generalized IMPRESSION: This study is suggestive of moderate diffuse encephalopathy, nonspecific etiology. No seizures or epileptiform discharges were seen throughout the recording. Charlsie Quest   CT HEAD CODE STROKE WO CONTRAST  Result Date: 08/05/2021 CLINICAL DATA:  Code stroke.  Right-sided gaze and aphasia EXAM: CT HEAD WITHOUT CONTRAST TECHNIQUE: Contiguous axial images were obtained from the base of the skull through the vertex without intravenous contrast. COMPARISON:  None. FINDINGS: Brain: Band of indistinct low-density at the left basal ganglia and corona radiata. No hemorrhage, hydrocephalus, or masslike finding. Vascular: No hyperdense vessel. Skull: Normal. Negative for  fracture or focal lesion. Sinuses/Orbits: Negative Other: Motion artifact. ASPECTS [facility] Stroke Program Early CT Score) - Ganglionic level infarction (caudate, lentiform nuclei, internal capsule, insula, M1-M3 cortex): 6 - Supraganglionic infarction (M4-M6 cortex): 3 Total score (0-10 with 10 being normal): 9 IMPRESSION: 1. Potentially acute perforator infarct at the left basal ganglia and corona radiata. 2. No acute hemorrhage 3. ASPECTS is 9 4. Motion artifact. Electronically Signed   By: Tiburcio Pea M.D.   On: 08/05/2021 07:28      PHYSICAL EXAM  Temp:  [97.2 F (36.2 C)-99.7 F (37.6 C)] 98.6 F (37 C) (09/18 0700) Pulse Rate:  [48-76] 63 (09/18 1400) Resp:  [15-31] 31 (09/18 1400) BP: (87-164)/(44-90) 122/63 (09/18 1400) SpO2:  [89 %-100 %] 98 % (09/18 1400) Arterial Line BP: (93-177)/(43-82) 177/82 (09/18 0800) FiO2 (%):  [30 %-40 %] 35 % (09/18 1145) Weight:  [58.9 kg] 58.9 kg (09/18 0500)  General - Cachectic, well developed, status post tracheostomy on trach collar  Ophthalmologic - fundi not visualized due to noncooperation.  Cardiovascular - Regular rhythm and rate.  Neuro - awake, alert, eyes open but lethargic, status post tracheostomy on trach collar, nonverbal however, follow midline commands.  Able to cross midline bilaterally but bilateral horizontal gaze incomplete.  Not blinking to be described bilaterally, PERRL.  Bilateral facial weakness. Tongue midline.  Left upper extremity flaccid, right upper extremity contracture at elbow, right hand slight movement spontaneously.  Bilateral lower extremity in rehab boot, no movement, left lower extremity flaccid, right lower extremity some muscle tone at knee level.  DTR diminished throughout, no Babinski bilaterally. Sensation, coordination and gait not tested.   ASSESSMENT/PLAN Dominic Benjamin is a 55 y.o. 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 No tPA given due to INR 2.0.    Acute onset left arm weakness - work up Counselling psychologist, patient was able to self suctioning trach with left arm in [facility] but now left arm flaccid CT x 2 left BG infarct (which likely to be chronic infarct) MRI brain pending MRI C-spine pending May consider CTA head and neck if decided on aggressive management 2D Echo  pending LDL pending HgbA1c 6.2 SCDs for VTE prophylaxis Coumadin prior to admission, now on no antithrombotics due to severe thrombocytopenia Therapy recommendations:  pending Disposition:  pending, per RN, family is considering comfort care measures.  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 Status post trach and PEG Management in [facility] PTA  History of stroke Lack of details 05/28/2011 left BG infarct on MRI Per note, residual  right-sided weakness after stroke Current CT x2 showed left BG infarct  Thrombocytopenia Platelet 83-39-29 Hold off antiplatelet at this time  Sepsis, septic shock Leukocytosis Fever (resolved) WBC 15.0-20.9-15.2 On cefepime CCM on board  Diabetes HgbA1c 6.2, goal < 7.0 Continued hyperglycemia Currently on Levemir CBG monitoring SSI  Hypotension Unstable, on the low end Currently off Levophed On midodrine 20mg  tid  Hyperlipidemia Home meds: Lipitor 40 LDL pending, goal < 70 Now on Lipitor 40 Continue statin at discharge  Other Stroke Risk Factors   Other Active Problems History of DVT, on Coumadin PTA, now off History of recent COVID  Hospital day # 2  This patient is critically ill due to quadriplegia status post transverse myelitis, sepsis septic shock, leukocytosis, thrombocytopenia and possible new stroke and at  significant risk of neurological worsening, death form severe sepsis, respite failure, bleeding from thrombocytopenia, recurrent stroke, seizure. This patient's care requires constant monitoring of vital signs, hemodynamics, respiratory and cardiac monitoring, review of multiple databases, neurological assessment, discussion with family, other specialists and medical decision making of high complexity. I spent 45 minutes of neurocritical care time in the care of this patient.  I discussed with Dr. .   Merrily Pew, MD PhD Stroke Neurology 08/07/2021 4:21 PM    To contact Stroke Continuity provider, please refer to 08/09/2021. After hours, contact General Neurology

## 2021-08-07 NOTE — Progress Notes (Signed)
eLink Physician-Brief Progress Note Patient Name: Raeford Brandenburg DOB: September 01, 1966 MRN: [id number]   Date of Service  08/07/2021  HPI/Events of Note  Going to MRI  eICU Interventions  - ok to place MRI compatible foley cath, not the temp monitor and place on Vent via trach while going to MRI and back.      Intervention Category Intermediate Interventions: Other:  Ranee Gosselin 08/07/2021, 8:44 PM

## 2021-08-07 NOTE — Progress Notes (Signed)
Daily Progress Note   Patient Name: Dominic Benjamin       Date: 08/07/2021 DOB: 14-Mar-1966  Age: 55 y.o. MRN#: [id number] Attending Physician: Cheri Fowler, MD Primary Care Physician: Rica Mast Admit Date: 08/05/2021  Reason for Consultation/Follow-up: Establishing goals of care  Subjective: Chart reviewed. Received update from bedside RN. Patient was placed on trach collar 11:25 and has been tolerating well so far. He is slightly more interactive today compared to yesterday.Levophed titrated off.   I spoke with brother Dominic Benjamin by phone. Update provided on patient's condition. Discussed that septic shock had resolved. Discussed that if Dominic Benjamin wished to continue full scope care, plan would be transfer back to [facility]. If decision was made to pursue comfort care, patient would remain in the hospital for EOL care. If he continues to tolerate trach collar, there may be the option to transfer to a hospice facility.  Dominic Benjamin states he is still considering transition to comfort care. However, he wants to visit his brother before making this decision. He plans to travel down from [place] in the next few days. Discussed continuing GOC discussions in-person when he arrives.     Length of Stay: 2   Physical Exam Vitals reviewed.  Constitutional:      General: He is not in acute distress.    Appearance: He is ill-appearing.  Pulmonary:     Comments: trach collar Abdominal:     Comments: PEG  Skin:    Comments: Sacral wound  Neurological:     Mental Status: He is alert.     Comments: Moves R upper extremity            Vital Signs: BP 122/63   Pulse 63   Temp 98.6 F (37 C)   Resp (!) 31   Ht 5\' 7"  (1.702 m)   Wt 58.9 kg   SpO2 98%   BMI 20.34 kg/m  SpO2: SpO2: 98 % O2 Device:  O2 Device: Tracheostomy Collar O2 Flow Rate: O2 Flow Rate (L/min): 8 L/min  Intake/output summary:  Intake/Output Summary (Last 24 hours) at 08/07/2021 1507 Last data filed at 08/07/2021 1400 Gross per 24 hour  Intake 2284.67 ml  Output 745 ml  Net 1539.67 ml   LBM: Last BM Date: 08/07/21 Baseline Weight: Weight: 65 kg Most recent weight: Weight: 58.9  kg       Palliative Assessment/Data: PPS 30%     Palliative Care Assessment & Plan   HPI/Patient Profile: 55 y.o. 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 He presented to the emergency department on 08/05/2021 with altered mental status and right-sided weakness. He was found to be in septic shock with high fevers, hypotension, and lactic acidosis. Started on vasopressor therapy. Source of sepsis likely UTI versus sacral wound.  CT head showed possible acute left basal ganglia stroke. Admitted to PCCM.   Patient was initially hospitalized at  [facility] 05/14/21 - 05/24/21 with acute renal failure. Several days after admission he started having lower extremity weakness. MRI 05/27/21 showed abnormal spinal cord signal concerning for transverse myelitis.  He was hospitalized at [facility] 05/25/21 - 07/01/21, where he was intubated and later underwent tracheostomy and PEG placement. He was transferred to [facility] on 07/01/21.    Assessment: - septic shock, resolved - Probable infected unstageable sacral wound - acute left basal ganglia stroke - acute on chronic respiratory with hypoxia - chronic trach, vent dependent - quadriparesis due to transverse myelitis  Recommendations/Plan: DNR/DNI as previously documented Continue current care Brother/HCPOA is  considering comfort care, but wants to see patient prior to making a decision - he is traveling from [place] in the next few days PMT will follow up tomorrow to find out a more specific date/time of his arrival   Goals of Care and Additional Recommendations: Limitations on Scope of Treatment: Full Scope Treatment  Code Status: DNR  Prognosis:  Poor overall  Discharge Planning: To Be Determined   Thank you for allowing the Palliative Medicine Team to assist in the care of this patient.   Total Time 25 minutes Prolonged Time Billed  no       Greater than 50%  of this time was spent counseling and coordinating care related to the above assessment and plan.  Merry Proud, NP  Please contact Palliative Medicine Team phone at [phone] for questions and concerns.

## 2021-08-07 NOTE — Progress Notes (Addendum)
NAME:  Dominic Benjamin, MRN:  [id number], DOB:  12/01/1965, LOS: 2 ADMISSION DATE:  08/05/2021, CONSULTATION DATE:  9/16 REFERRING MD:  Dr. 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  Patient last hospital admission on July 2022 w/ AKI, quadriparesis w/ transverse myelitis and resp failure. Patient was trached on June 23, 2021. Followed by Dr. Welton Flakes pulmonology for vent management.  Patient presents to [facility] ED on 9/16 w/ AMS and R sided weakness. CT Potentially acute perforator infarct at the left basal ganglia and corona radiata. Neuro consulted. Recommend MRI when stable. Trach on mechanical ventilation. Fever 106 F. WBC 15. UA w/ leukocytes and bacteria. CXR unremarkable. Started on cefepime and vanc. Lactate 5. Started on Levo. ABG 7.33, 39, 70, 21. K 5.4, glucose 245, BUN 68, Creat 0.60, ionized calcium 0.9. covid positive.  Significant Hospital Events: Including procedures, antibiotic start and stop dates in addition to other pertinent events   9/16: admitted R sided weakness, sepsis, and AMS; trached on vent  Interim History / Subjective:  Levophed was titrated off Remained afebrile Tolerated pressure support trial for few hours yesterday This morning did not tolerate trach collar trial  Objective   Blood pressure (!) 87/44, pulse (!) 57, temperature 98.6 F (37 C), resp. rate (!) 25, height 5\' 7"  (1.702 m), weight 58.9 kg, SpO2 93 %.    Vent Mode: PSV;CPAP FiO2 (%):  [30 %-40 %] 40 % Set Rate:  [24 bmp] 24 bmp Vt Set:  [400 mL] 400 mL PEEP:  [5 cmH20] 5 cmH20 Pressure Support:  [8 cmH20] 8 cmH20 Plateau Pressure:   [15 cmH20] 15 cmH20   Intake/Output Summary (Last 24 hours) at 08/07/2021 1012 Last data filed at 08/07/2021 1000 Gross per 24 hour  Intake 3574.98 ml  Output 1225 ml  Net 2349.98 ml   Filed Weights   08/05/21 0700 08/06/21 0500 08/07/21 0500  Weight: 65 kg 58.7 kg 58.9 kg    Examination: General:  critically ill appearing middle-aged Caucasian male, looks older than his stated age, lying on the bed HEENT: Atraumatic, normocephalic MM pink/moist; s/p trach Neuro: Opens eyes with vocal stimuli, tracking examiner, following simple commands.  Remains quadriplegic.  Gaze is central CV: Regular rate and rhythm, no murmur appreciated PULM: Bilateral rhonchi, no wheezes or crackles GI: soft, bsx4 active; PEG tube in place without drainage or redness Extremities: warm/dry Skin: no rashes or lesions appreciated   Resolved Hospital Problem list   Septic shock Lactic acidosis  Assessment & Plan:  Acute left basal ganglia stroke Appreciated stroke team input Continue secondary stroke prophylaxis with aspirin and atorvastatin Will get an MRI brain  Repeat head CT showed no new changes  Probable infected unstageable decubitus ulcer (POA) Septic shock is resolved UTI was ruled out Demand cardiac ischemia Patient is off vasopressors Urine cultures grew yeast Foley catheter was changed as he has indwelling Foley catheter Continue IV cefepime Continue wound care  Acute on chronic respiratory failure w/ hypoxia Chronic trach vent dependent: trached 06/2021 quadriparesis w/ transverse myelitis Continue lung protective ventilation Patient is tolerating spontaneous breathing trial Patient failed trach collar trial this morning, will try  again later vap prevention in place Trach care protocol in place Patient does have COVID positivity, labs showed no viral load.  Off isolation  Acute septic encephalopathy:  Patient mental status has improved significantly with improvement septic  shock He is following simple commands and tracking examiner Avoid sedation   Hypokalemia, hypocalcemia, hypomagnesemia and hypophosphatemia Continue aggressive electrolyte supplement and monitor  Anxiety disorder Continue sertraline  Restless leg syndrome Holding ropinirole  Diabetes type 2 with hyperglycemia Patient does have well-controlled diabetes at baseline with hemoglobin A1c 6.2 He is persistently hyperglycemic here Increase Levemir to 22 units twice daily Continue sliding scale insulin and tube feed coverage every 4 hours  Thrombocytopenia Likely in the setting of sepsis Watch for signs of bleeding, closely monitor platelet count  Unstageable decubitus ulcer on the sacrum (POA) Severe protein calorie malnutrition (POA) Wound care follow-up Dietitian follow-up  Best Practice (right click and "Reselect all SmartList Selections" daily)   Diet/type: Tube feed DVT prophylaxis: prophylactic heparin  GI prophylaxis: H2 blocker Lines: Central line, discontinue Foley: Yes, still needed (indwelling) Code Status:  full code Last date of multidisciplinary goals of care discussion [9/17.  Palliative care had phone meeting with patient's brother, he decided to proceed with DNR and continue medical care. DNR orders were written   Labs   CBC: Recent Labs  Lab 08/05/21 0537 08/05/21 0737 08/05/21 0751 08/06/21 0736 08/07/21 0321  WBC 12.5* 15.0*  --  20.9* 15.2*  NEUTROABS  --  11.8*  --   --   --   HGB 15.4 15.9 17.0  17.3* 13.0 10.4*  HCT 49.5 53.1* 50.0  51.0 39.4 33.4*  MCV 89.4 93.8  --  87.0 90.0  PLT 81* 83*  --  39* 29*    Basic Metabolic Panel: Recent Labs  Lab 08/05/21 0537 08/05/21 0737 08/05/21 0751 08/05/21 1443 08/06/21 0438 08/07/21 0321  NA 136 137 135  135 134* 135 135  K 5.2* 5.4* 5.4*  5.4* 3.6 2.9* 4.1  CL 105 102 106 101 106 111  CO2 20* 20*  --  20* 19* 19*  GLUCOSE 263* 244* 245* 284* 378* 345*  BUN 64* 63* 68* 58* 39* 32*   CREATININE 0.61 0.72 0.60* 0.55* 0.43* 0.31*  CALCIUM 8.1* 8.4*  --  7.7* 8.4* 8.1*  MG 2.0  --   --   --  1.8 2.0  PHOS  --   --   --   --   --  1.3*   GFR: Estimated Creatinine Clearance: 90 mL/min (A) (by C-G formula based on SCr of 0.31 mg/dL (L)). Recent Labs  Lab 08/05/21 0537 08/05/21 0737 08/05/21 0754 08/05/21 0952 08/05/21 1443 08/06/21 0438 08/06/21 0736 08/06/21 1015 08/07/21 0321  PROCALCITON  --   --   --   --  1.10 0.89  --   --  0.55  WBC 12.5* 15.0*  --   --   --   --  20.9*  --  15.2*  LATICACIDVEN 2.6*  --  5.0* 4.2*  --   --   --  2.8*  --     Liver Function Tests: Recent Labs  Lab 08/05/21 0537 08/05/21 0737 08/05/21 1443 08/06/21 0438  AST 76* 86* 59* 31  ALT 79* 91* 82* 65*  ALKPHOS 112 123 105 87  BILITOT 1.2 1.4* 1.4* 1.0  PROT 5.7* 6.0* 4.9* 4.4*  ALBUMIN 3.2* 3.4* 2.8* 2.4*   No results for input(s): LIPASE, AMYLASE in the last 168 hours.  Recent Labs  Lab 08/05/21 0537  AMMONIA 44*    ABG    Component Value Date/Time   PHART 7.443 08/05/2021 0525   PCO2ART 29.5 (L) 08/05/2021 0525   PO2ART 134 (H) 08/05/2021 0525   HCO3 21.3 08/05/2021 0751   TCO2 21 (L) 08/05/2021 0751   TCO2 22 08/05/2021 0751   ACIDBASEDEF 4.0 (H) 08/05/2021 0751   O2SAT 93.0 08/05/2021 0751     Coagulation Profile: Recent Labs  Lab 08/04/21 0452 08/05/21 0537 08/05/21 0737 08/06/21 0438 08/07/21 0321  INR 2.5* 2.0* 2.0* 2.5* 2.2*    Cardiac Enzymes: Recent Labs  Lab 08/05/21 0537 08/05/21 1443  CKTOTAL 46* 85  CKMB 2.8  --     HbA1C: Hgb A1c MFr Bld  Date/Time Value Ref Range Status  08/05/2021 02:43 PM 6.2 (H) 4.8 - 5.6 % Final    Comment:    (NOTE) Pre diabetes:          5.7%-6.4%  Diabetes:              >6.4%  Glycemic control for   <7.0% adults with diabetes   07/10/2021 07:04 AM 6.2 (H) 4.8 - 5.6 % Final    Comment:    (NOTE) Pre diabetes:          5.7%-6.4%  Diabetes:              >6.4%  Glycemic control for    <7.0% adults with diabetes     CBG: Recent Labs  Lab 08/06/21 1523 08/06/21 1937 08/06/21 2314 08/07/21 0320 08/07/21 0736  GLUCAP 318* 321* 327* 333* 299*    Total critical care time: 37 minutes  Performed by: Cheri Fowler   Critical care time was exclusive of separately billable procedures and treating other patients.   Critical care was necessary to treat or prevent imminent or life-threatening deterioration.   Critical care was time spent personally by me on the following activities: development of treatment plan with patient and/or surrogate as well as nursing, discussions with consultants, evaluation of patient's response to treatment, examination of patient, obtaining history from patient or surrogate, ordering and performing treatments and interventions, ordering and review of laboratory studies, ordering and review of radiographic studies, pulse oximetry and re-evaluation of patient's condition.   Cheri Fowler MD [facility] See Amion for pager If no response to pager, please call [phone] until 7pm After 7pm, Please call E-link [phone]

## 2021-08-07 NOTE — Progress Notes (Signed)
Per patient's brother Zyhir Cappella patient's preferred name is Dominic Benjamin

## 2021-08-07 NOTE — Progress Notes (Signed)
Pt placed on trach collar 8L 35% per MD. Pt tolerating well at this time, vitals stable, RN aware, MD aware, RT will continue to monitor.   08/07/21 1122  Respiratory Assessment  Assessment Type Assess only  Respiratory Pattern Regular;Unlabored  Chest Assessment Chest expansion symmetrical  Cough Productive  Sputum Amount Moderate  Sputum Color White;Clear  Sputum Consistency Thick  Sputum Specimen Source Tracheostomy tube  Bilateral Breath Sounds Diminished;Rhonchi  Oxygen Therapy/Pulse Ox  O2 Device (S)  Tracheostomy Collar (Per MD)  O2 Therapy Oxygen humidified  O2 Flow Rate (L/min) 8 L/min  FiO2 (%) 35 %  SpO2 100 %  Tracheostomy Shiley Flexible 6 mm Cuffed  Placement Date/Time: 08/05/21 0930   Inserted prior to hospital arrival?: Placed at another facility  Brand: Shiley Flexible  Size (mm): 6 mm  Style: Cuffed  Status Secured  Site Assessment Oozing secretions  Site Care Cleansed;Dried;Dressing applied  Inner Cannula Care Changed/new  Ties Assessment Clean;Dry;Secure  Tracheostomy Equipment at bedside Yes and checklist posted at head of bed

## 2021-08-07 NOTE — Progress Notes (Signed)
eLink Physician-Brief Progress Note Patient Name: Claudia Alvizo DOB: 1966-06-05 MRN: [id number]   Date of Service  08/07/2021  HPI/Events of Note  Platelets at 29 K. No bleding. Hg ok. From septic shock.CVA.   eICU Interventions  continue care, call back if new neuro changes or any bleeding for platelet transfusions.      Intervention Category Major Interventions: Other:  Ranee Gosselin 08/07/2021, 4:08 AM

## 2021-08-08 ENCOUNTER — Inpatient Hospital Stay (HOSPITAL_COMMUNITY): Payer: Medicare Other

## 2021-08-08 DIAGNOSIS — Z8673 Personal history of transient ischemic attack (TIA), and cerebral infarction without residual deficits: Secondary | ICD-10-CM | POA: Diagnosis not present

## 2021-08-08 DIAGNOSIS — I6389 Other cerebral infarction: Secondary | ICD-10-CM | POA: Diagnosis not present

## 2021-08-08 DIAGNOSIS — I639 Cerebral infarction, unspecified: Secondary | ICD-10-CM | POA: Diagnosis not present

## 2021-08-08 DIAGNOSIS — A419 Sepsis, unspecified organism: Secondary | ICD-10-CM | POA: Diagnosis not present

## 2021-08-08 DIAGNOSIS — L89150 Pressure ulcer of sacral region, unstageable: Secondary | ICD-10-CM | POA: Diagnosis not present

## 2021-08-08 DIAGNOSIS — R6521 Severe sepsis with septic shock: Secondary | ICD-10-CM | POA: Diagnosis not present

## 2021-08-08 DIAGNOSIS — E43 Unspecified severe protein-calorie malnutrition: Secondary | ICD-10-CM | POA: Diagnosis not present

## 2021-08-08 LAB — CBC WITH DIFFERENTIAL/PLATELET
Abs Immature Granulocytes: 0.07 10*3/uL (ref 0.00–0.07)
Basophils Absolute: 0 10*3/uL (ref 0.0–0.1)
Basophils Relative: 0 %
Eosinophils Absolute: 0.1 10*3/uL (ref 0.0–0.5)
Eosinophils Relative: 1 %
HCT: 31.7 % — ABNORMAL LOW (ref 39.0–52.0)
Hemoglobin: 9.8 g/dL — ABNORMAL LOW (ref 13.0–17.0)
Immature Granulocytes: 1 %
Lymphocytes Relative: 12 %
Lymphs Abs: 1 10*3/uL (ref 0.7–4.0)
MCH: 28.5 pg (ref 26.0–34.0)
MCHC: 30.9 g/dL (ref 30.0–36.0)
MCV: 92.2 fL (ref 80.0–100.0)
Monocytes Absolute: 0.5 10*3/uL (ref 0.1–1.0)
Monocytes Relative: 6 %
Neutro Abs: 6.6 10*3/uL (ref 1.7–7.7)
Neutrophils Relative %: 80 %
Platelets: 28 10*3/uL — CL (ref 150–400)
RBC: 3.44 MIL/uL — ABNORMAL LOW (ref 4.22–5.81)
RDW: 19.9 % — ABNORMAL HIGH (ref 11.5–15.5)
WBC: 8.3 10*3/uL (ref 4.0–10.5)
nRBC: 0 % (ref 0.0–0.2)

## 2021-08-08 LAB — GLUCOSE, CAPILLARY
Glucose-Capillary: 112 mg/dL — ABNORMAL HIGH (ref 70–99)
Glucose-Capillary: 167 mg/dL — ABNORMAL HIGH (ref 70–99)
Glucose-Capillary: 145 mg/dL — ABNORMAL HIGH (ref 70–99)
Glucose-Capillary: 118 mg/dL — ABNORMAL HIGH (ref 70–99)
Glucose-Capillary: 121 mg/dL — ABNORMAL HIGH (ref 70–99)
Glucose-Capillary: 143 mg/dL — ABNORMAL HIGH (ref 70–99)

## 2021-08-08 LAB — ECHOCARDIOGRAM COMPLETE
Area-P 1/2: 3.6 cm2
Height: 67 in
S' Lateral: 2.9 cm
Weight: 2169.33 oz

## 2021-08-08 LAB — PROTIME-INR
INR: 1.3 — ABNORMAL HIGH (ref 0.8–1.2)
Prothrombin Time: 16.4 seconds — ABNORMAL HIGH (ref 11.4–15.2)

## 2021-08-08 LAB — LIPID PANEL
Cholesterol: 95 mg/dL (ref 0–200)
HDL: 25 mg/dL — ABNORMAL LOW (ref >40)
LDL Cholesterol: 43 mg/dL (ref 0–99)
Total CHOL/HDL Ratio: 3.8 RATIO
Triglycerides: 136 mg/dL (ref <150)
VLDL: 27 mg/dL (ref 0–40)

## 2021-08-08 MED ORDER — DOCUSATE SODIUM 50 MG/5ML PO LIQD: 100.0000 mg | Freq: Two times a day (BID) | ORAL | 0 refills | Status: AC | PRN

## 2021-08-08 MED ORDER — MIDODRINE HCL 10 MG PO TABS: 20.0000 mg | ORAL_TABLET | Freq: Three times a day (TID) | ORAL | Status: DC

## 2021-08-08 MED ORDER — GABAPENTIN 250 MG/5ML PO SOLN
100.0000 mg | Freq: Two times a day (BID) | ORAL | Status: DC
  Administered 2021-08-08 – 2021-08-10 (×4): 100 mg
  Filled 2021-08-08 (×6): qty 2

## 2021-08-08 MED ORDER — POTASSIUM & SODIUM PHOSPHATES 280-160-250 MG PO PACK: 1.0000 | PACK | Freq: Three times a day (TID) | ORAL | 0 refills | Status: AC

## 2021-08-08 MED ORDER — POLYETHYLENE GLYCOL 3350 17 G PO PACK: 17.0000 g | PACK | Freq: Every day | ORAL | 0 refills | Status: AC | PRN

## 2021-08-08 MED ORDER — COLLAGENASE 250 UNIT/GM EX OINT: TOPICAL_OINTMENT | Freq: Every day | CUTANEOUS | 0 refills | Status: AC

## 2021-08-08 MED ORDER — SILVER NITRATE-POT NITRATE 75-25 % EX MISC
1.0000 | Freq: Every day | CUTANEOUS | Status: DC
  Administered 2021-08-09 – 2021-08-10 (×2): 1 via TOPICAL
  Filled 2021-08-08 (×2): qty 1

## 2021-08-08 MED ORDER — GABAPENTIN 250 MG/5ML PO SOLN: 100.0000 mg | Freq: Two times a day (BID) | ORAL | 12 refills | Status: AC

## 2021-08-08 MED ORDER — LACTATED RINGERS IV BOLUS
250.0000 mL | Freq: Once | INTRAVENOUS | Status: AC
  Administered 2021-08-08: 250 mL via INTRAVENOUS

## 2021-08-08 MED ORDER — SODIUM CHLORIDE 0.9 % IV SOLN: 2.0000 g | Freq: Three times a day (TID) | INTRAVENOUS | Status: AC

## 2021-08-08 NOTE — Progress Notes (Signed)
Physical Therapy Wound Treatment Patient Details  Name: Dominic Benjamin MRN: [id number] Date of Birth: Dec 24, 1965  Today's Date: 08/08/2021 Time: 3845-3646 Time Calculation (min): 57 min  Subjective  Subjective Assessment Subjective: Shaking head yes and no to answer questions. Patient and Family Stated Goals: pt unable to participate due to lethargy Date of Onset:  (present on admission) Prior Treatments: unknown  Pain Score:  Premedicated and only complained of pain in the R UE  Wound Assessment  Pressure Injury 08/05/21 Coccyx Medial Unstageable - Full thickness tissue loss in which the base of the injury is covered by slough (yellow, tan, gray, green or brown) and/or eschar (tan, brown or black) in the wound bed. eschar (Active)  Dressing Type ABD;Barrier Film (skin prep);Gauze (Comment);Moist to dry;Santyl 08/08/21 1142  Dressing Changed;Clean;Dry;Intact 08/08/21 1142  Dressing Change Frequency Daily 08/08/21 1142  State of Healing Eschar 08/08/21 1142  Site / Wound Assessment Black;Pink;Yellow 08/08/21 1142  % Wound base Red or Granulating 10% 08/08/21 1142  % Wound base Yellow/Fibrinous Exudate 20% 08/08/21 1142  % Wound base Black/Eschar 70% 08/08/21 1142  % Wound base Other/Granulation Tissue (Comment) 0% 08/08/21 1142  Peri-wound Assessment Erythema (non-blanchable) 08/08/21 1142  Wound Length (cm) 9 cm 08/06/21 0936  Wound Width (cm) 7.9 cm 08/06/21 0936  Wound Depth (cm) 0.8 cm 08/06/21 0936  Wound Surface Area (cm^2) 71.1 cm^2 08/06/21 0936  Wound Volume (cm^3) 56.88 cm^3 08/06/21 0936  Tunneling (cm) 0 08/08/21 1142  Undermining (cm) 0 08/08/21 1142  Margins Unattached edges (unapproximated) 08/08/21 1142  Drainage Amount None 08/08/21 1142  Drainage Description Serosanguineous 08/08/21 1142  Treatment Debridement (Selective);Hydrotherapy (Pulse lavage);Packing (Saline gauze) 08/08/21 1142      Hydrotherapy Pulsed lavage therapy - wound location:  sacrum Pulsed Lavage with Suction (psi): 8 psi Pulsed Lavage with Suction - Normal Saline Used: 1000 mL Pulsed Lavage Tip: Tip with splash shield Selective Debridement Selective Debridement - Location: sacrum Selective Debridement - Tools Used: Forceps, Scalpel Selective Debridement - Tissue Removed: black eschar    Wound Assessment and Plan  Wound Therapy - Assess/Plan/Recommendations Wound Therapy - Clinical Statement: Significant amount of eschar able to be removed this session. RN reports pt may d/c back to [facility] later today - hydrotherapy will continue to follow while admitted. Acutely, this patient will benefit from continued hydrotherapy for selective removal of unviable tissue, to decrease bioburden, and promote wound bed healing. Wound Therapy - Functional Problem List: limited turning/positioning surfaces Factors Delaying/Impairing Wound Healing: Altered sensation, Incontinence, Infection - systemic/local, Immobility, Polypharmacy Hydrotherapy Plan: Debridement, Dressing change, Patient/family education, Pulsatile lavage with suction Wound Therapy - Frequency: 6X / week Wound Therapy - Follow Up Recommendations: f/u selective debridement, dressing changes by RN  Wound Therapy Goals- Improve the function of patient's integumentary system by progressing the wound(s) through the phases of wound healing (inflammation - proliferation - remodeling) by: Wound Therapy Goals - Improve the function of patient's integumentary system by progressing the wound(s) through the phases of wound healing by: Decrease Necrotic Tissue to: 60 Decrease Necrotic Tissue - Progress: Progressing toward goal Increase Granulation Tissue to: 40 Increase Granulation Tissue - Progress: Progressing toward goal Decrease Length/Width/Depth by (cm): 0.3/0.3/NA will likely increase depth Decrease Length/Width/Depth - Progress: Progressing toward goal Goals/treatment plan/discharge plan were made with and agreed  upon by patient/family: Yes Time For Goal Achievement: 7 days Wound Therapy - Potential for Goals: Fair  Goals will be updated until maximal potential achieved or discharge criteria met.  Discharge criteria: when goals  achieved, discharge from hospital, MD decision/surgical intervention, no progress towards goals, refusal/missing three consecutive treatments without notification or medical reason.  GP     Charges PT Wound Care Charges $Wound Debridement up to 20 cm: < or equal to 20 cm $ Wound Debridement each add'l 20 sqcm: 3 $PT PLS Gun and Tip: 1 Supply $PT Hydrotherapy Visit: 1 Visit       Dominic Benjamin 08/08/2021, 11:53 AM  Rolinda Roan, PT, DPT Acute Rehabilitation Services Pager: [phone] Office: [phone]

## 2021-08-08 NOTE — TOC Transition Note (Addendum)
Transition of Care [facility]) - CM/SW Discharge Note **Discharge cancelled 9/19 - See below    Patient Details  Name: Dominic Benjamin MRN: [id number] Date of Birth: [birth year]  Transition of Care [facility] CM/SW Contact:  Cristobal Goldmann, LCSW Phone Number: 08/08/2021, 1:46 PM   Clinical Narrative: [name] is being discharged back to [facility] today. Patient's nurse on 3-M will give report and patient will be wheeled to  [facility] by nursing staff. The number for report is [phone] and patient is going to room 26.  2:17 pm: Received a call from [place] with [facility]. CSW informed that patient is not discharging to today as his platelets are still low. Victorino Dike added that she is not sure that they will have a bed tomorrow.  CSW will continue to follow.        Patient Goals and CMS Choice        Discharge Placement  Back to [facility] if a bed is available once medically stable.                     Discharge Plan and Services  Back to [facility] if a bed is available once medically stable.                                   Social Determinants of Health (SDOH) Interventions  No SDOH interventions requested or needed at discharge.   Readmission Risk Interventions No flowsheet data found.

## 2021-08-08 NOTE — Plan of Care (Signed)
°  Problem: Clinical Measurements: °Goal: Respiratory complications will improve °Outcome: Progressing °  °Problem: Clinical Measurements: °Goal: Respiratory complications will improve °Outcome: Progressing °  °

## 2021-08-08 NOTE — Discharge Summary (Addendum)
Physician Discharge Summary  Patient ID: Dominic Benjamin MRN: [id number] DOB/AGE: 05-02-1966 55 y.o.  Admit date: 08/05/2021 Discharge date: Aug 16, 2021  Admission Diagnoses:  Discharge Diagnoses:  Active Problems:   Septic shock (HCC)   Stroke (HCC)   Protein-calorie malnutrition, severe   Pressure injury of skin   Discharged Condition: stable  Hospital Course: 55 year old man transferred from [facility] with altered mental status and new right-sided weakness.  History of ventilator dependent respiratory failure since June 23, 2021 due to quadriparesis from transverse myelitis.  History of type 2 diabetes with diabetic ulcers, osteomyelitis, axillary vein DVT, restless leg syndrome, chronic pain and prior CVA.  MRI brain and cervical spine show no acute intracranial processes.  No new spinal lesions.  MRI spine most notable for severe cervical stenosis.  Found to be in septic shock with fever.  Started on antibiotics and vasopressor therapy.  Possible sources are unstageable sacral decubitus or pneumonia.  Septic shock resolved on empiric cefepime.  Cultures were negative.  Developed thrombocytopenia likely due to sepsis. Counts increasing at the time of discharge.  Seen by palliative care.  Now DNR.  Family would be in favor of transition to comfort care but the patient continues to indicate that he wants ongoing aggressive care.  Patient discharge was delayed 1 day for bed availability.   Consults: pulmonary/intensive care and palliative care  Significant Diagnostic Studies: radiology: MRI: C-spine and head.  Treatments: antibiotics: Cefepime  Discharge Exam: Blood pressure (!) 109/53, pulse (!) 59, temperature 97.7 F (36.5 C), temperature source Oral, resp. rate (!) 25, height 5\' 7"  (1.702 m), weight 61.5 kg, SpO2 100 %. On examination at the time of discharge he is tolerating trach collar.  He is off vasopressors.  He will open eyes and not appropriately to questions.   Diffuse muscle wasting.  Contracture right upper extremity.  Unable to move left arm.  Sacral decubitus present.  Skin otherwise intact.  Disposition:   [facility] to [facility].  Complete 7-day course of antibiotics for presumed pneumonia.  Ongoing wound care.   Allergies as of 08/08/2021   No Known Allergies      Medication List     STOP taking these medications    warfarin 5 MG tablet Commonly known as: COUMADIN       TAKE these medications    Acetaminophen 650 MG/20.3ML Soln Take 650 mg by mouth every 6 (six) hours as needed (nrs 1-3 and cpot of 1-2).   acetaminophen 325 MG tablet Commonly known as: TYLENOL Take 650 mg by mouth every 6 (six) hours as needed (pain scale 1-4 or fever of >101).   ACIDOPHILUS LACTOBACILLUS PO Take 1 capsule by mouth daily.   antiseptic oral rinse Liqd 15 mLs by Mouth Rinse route in the morning, at noon, in the evening, and at bedtime.   aspirin 81 MG chewable tablet Chew 81 mg by mouth daily.   atorvastatin 40 MG tablet Commonly known as: LIPITOR Take 40 mg by mouth at bedtime.   baclofen 10 MG tablet Commonly known as: LIORESAL Take 10 mg by mouth 3 (three) times daily.   BANATROL PO Take 1 packet by mouth in the morning and at bedtime.   Carboxymethylcellulose Sod PF 0.5 % Soln Place 1 drop into both eyes in the morning, at noon, in the evening, and at bedtime.   ceFEPIme 2 g in sodium chloride 0.9 % 100 mL Inject 2 g into the vein every 8 (eight) hours.   collagenase ointment Commonly  known as: SANTYL Apply topically daily. Start taking on: August 09, 2021   dextrose 5 % and 0.45% NaCl 5-0.45 % Inject 1,000 mLs into the vein as needed (hypoglycemia). 35ml/hour   dextrose 50 % solution Inject 25 mLs into the vein as needed for low blood sugar.   diazepam 5 MG tablet Commonly known as: VALIUM Take 5 mg by mouth 2 (two) times daily as needed for anxiety. Hold for HR < 55 or BP < 90/60   docusate 50 MG/5ML  liquid Commonly known as: COLACE Place 10 mLs (100 mg total) into feeding tube 2 (two) times daily as needed for mild constipation.   famotidine 20 MG tablet Commonly known as: PEPCID Take 20 mg by mouth 2 (two) times daily.   feeding supplement (PRO-STAT 64) Liqd Take 30 mLs by mouth in the morning and at bedtime.   gabapentin 250 MG/5ML solution Commonly known as: NEURONTIN Place 2 mLs (100 mg total) into feeding tube every 12 (twelve) hours.   GLUCAGEN HYPOKIT IJ Inject 1 mg as directed as needed (hypoglycemia).   guaiFENesin 200 MG/10ML Soln Take 200 mg by mouth every 8 (eight) hours.   HYDROcodone-acetaminophen 5-325 MG tablet Commonly known as: NORCO/VICODIN Take 1 tablet by mouth every 6 (six) hours as needed for moderate pain (for NRS 4-6 and CPOT of 1-2).   insulin glargine 100 UNIT/ML injection Commonly known as: LANTUS Inject 8 Units into the skin at bedtime. Hold if tube feeds stopped or NPO or BS is <70   insulin lispro 100 UNIT/ML injection Commonly known as: HUMALOG Inject 2-12 Units into the skin every 6 (six) hours. Per sliding scale   ipratropium-albuterol 0.5-2.5 (3) MG/3ML Soln Commonly known as: DUONEB Take 3 mLs by nebulization every 6 (six) hours as needed (wheezing/shortness of breath).   lidocaine 5 % Commonly known as: LIDODERM Place 1 patch onto the skin daily. Remove & Discard patch within 12 hours or as directed by MD   magnesium oxide 400 MG tablet Commonly known as: MAG-OX Take 400 mg by mouth every 6 (six) hours as needed (for 4 doses as needed for magnesium levels).   Magnesium Sulfate in D5W 10-5 GM/100ML-% Soln Inject 1 g into the vein as needed (magnesium levels).   melatonin 3 MG Tabs tablet Take 3 mg by mouth at bedtime.   methylPREDNISolone Ace-Lido 40-10 MG/ML Susp Inject 40 mg into the vein in the morning and at bedtime.   midodrine 10 MG tablet Commonly known as: PROAMATINE Place 2 tablets (20 mg total) into feeding  tube 3 (three) times daily with meals.   NUTRISOURCE FIBER PO Take 1 packet by mouth in the morning, at noon, and at bedtime.   oxyCODONE 5 MG immediate release tablet Commonly known as: Oxy IR/ROXICODONE Take 5 mg by mouth every 8 (eight) hours as needed for severe pain. Hold for HR <55 for BP < 90/60   polyethylene glycol 17 g packet Commonly known as: MIRALAX / GLYCOLAX Place 17 g into feeding tube daily as needed for moderate constipation.   potassium & sodium phosphates 280-160-250 MG Pack Commonly known as: PHOS-NAK Place 1 packet into feeding tube 4 (four) times daily -  with meals and at bedtime.   potassium chloride 10 MEQ/100ML Inject 10 mEq into the vein as needed (potassium levels).   POTASSIUM EFFERVESCENT PO Take 40 mEq by mouth every 4 (four) hours as needed (potassium levels).   rOPINIRole 1 MG tablet Commonly known as: REQUIP Take 1 mg  by mouth at bedtime.   sertraline 50 MG tablet Commonly known as: ZOLOFT Take 50 mg by mouth daily.   sodium chloride 0.9 % infusion Inject 75 mLs into the vein continuous. 63ml/hour   sodium polystyrene 15 GM/60ML suspension Commonly known as: KAYEXALATE Take 15-30 g by mouth as needed (high potassium levels).   TAB-A-VITE/BETA CAROTENE PO Take 1 tablet by mouth daily.   The Very Finest Fish Oil Liqd Take 6,000 mg by mouth daily.         Signed: Lynnell Catalan, MD [facility] ICU Physician [facility] [place] [facility]  Pager: [phone] Or Epic Secure Chat After hours: [phone].  08/13/2021, 10:16 AM

## 2021-08-08 NOTE — Progress Notes (Signed)
  Echocardiogram 2D Echocardiogram has been performed.  Dominic Benjamin 08/08/2021, 3:29 PM

## 2021-08-08 NOTE — Progress Notes (Signed)
eLink Physician-Brief Progress Note Patient Name: Dominic Benjamin DOB: February 12, 1966 MRN: [id number]   Date of Service  08/08/2021  HPI/Events of Note  marginal BP 84/51 (MAP 62) on midodrine.   Off levo since yesterday and dc  Discussed with RN. MAP low since came from MRI. On Vent-trach resting. 30%.   eICU Interventions  Autonomic/neuro dysfunction LR 250 ml bolus.       Intervention Category Intermediate Interventions: Hypotension - evaluation and management  Ranee Gosselin 08/08/2021, 1:26 AM

## 2021-08-08 NOTE — Progress Notes (Signed)
NAME:  Dominic Benjamin, MRN:  [id number], DOB:  06/03/1966, LOS: 3 ADMISSION DATE:  08/05/2021, CONSULTATION DATE:  9/16 REFERRING MD:  Dr. 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  Patient last hospital admission on July 2022 w/ AKI, quadriparesis w/ transverse myelitis and resp failure. Patient was trached on June 23, 2021. Followed by Dr. Welton Flakes pulmonology for vent management.  Patient presents to [facility] ED on 9/16 w/ AMS and R sided weakness. CT Potentially acute perforator infarct at the left basal ganglia and corona radiata. Neuro consulted. Recommend MRI when stable. Trach on mechanical ventilation. Fever 106 F. WBC 15. UA w/ leukocytes and bacteria. CXR unremarkable. Started on cefepime and vanc. Lactate 5. Started on Levo. ABG 7.33, 39, 70, 21. K 5.4, glucose 245, BUN 68, Creat 0.60, ionized calcium 0.9. covid positive.  Significant Hospital Events: Including procedures, antibiotic start and stop dates in addition to other pertinent events   9/16: admitted R sided weakness, sepsis, and AMS; trached on vent MRI brain and spine show nil acute. Severe cervical stenosis present.   Interim History / Subjective:  Levophed was titrated off Remained afebrile Tolerating trach collar.  Complains of arm pain.   Objective   Blood pressure (!) 102/56, pulse (!) 55, temperature 97.7 F (36.5 C), temperature source Oral, resp. rate 20, height 5\' 7"  (1.702 m), weight 61.5 kg, SpO2 100 %.    Vent Mode: PRVC FiO2 (%):  [30 %-100 %] 35 % Set Rate:  [24 bmp] 24 bmp Vt Set:  [400 mL] 400 mL PEEP:  [5 cmH20] 5 cmH20 Plateau Pressure:   [13 cmH20-14 cmH20] 13 cmH20   Intake/Output Summary (Last 24 hours) at 08/08/2021 1403 Last data filed at 08/08/2021 1215 Gross per 24 hour  Intake 2606.39 ml  Output 1840 ml  Net 766.39 ml    Filed Weights   08/06/21 0500 08/07/21 0500 08/08/21 0600  Weight: 58.7 kg 58.9 kg 61.5 kg    Examination: General:  critically ill appearing middle-aged Caucasian male, looks older than his stated age, lying on the bed HEENT: Atraumatic, normocephalic MM pink/moist; s/p trach Neuro: Opens eyes with vocal stimuli, tracking examiner, following simple commands.  Remains quadriplegic.  Gaze is central CV: Regular rate and rhythm, no murmur appreciated PULM: Bilateral rhonchi, no wheezes or crackles GI: soft, bsx4 active; PEG tube in place without drainage or redness Extremities: warm/dry Skin: no rashes or lesions appreciated   Resolved Hospital Problem list   Septic shock Lactic acidosis  Assessment & Plan:  Acute left basal ganglia stroke Probable infected unstageable decubitus ulcer (POA) Septic shock is resolved UTI was ruled out Demand cardiac ischemia Acute on chronic respiratory failure w/ hypoxia Chronic trach vent dependent: trached 06/2021 quadriparesis w/ transverse myelitis Acute septic encephalopathy:  Hypokalemia, hypocalcemia, hypomagnesemia and hypophosphatemia Anxiety disorder Restless leg syndrome Diabetes type 2 with hyperglycemia Thrombocytopenia due to sepsis.  Unstageable decubitus ulcer on the sacrum (POA) Severe protein calorie malnutrition (POA)  Plan:  - open ended trach collar trial - complete 7 days of antibiotics - gabapentin for possible neuropathic pain. -Transfer back to [facility] once PLT count begins to recover.  Best Practice (right click and "Reselect all SmartList Selections" daily)   Diet/type: Tube feed DVT prophylaxis: prophylactic heparin  GI prophylaxis: H2 blocker Lines: Central line, discontinue Foley: Yes, still needed  (indwelling) Code Status:  full code Last date of multidisciplinary goals of care discussion [9/17.  Palliative care had phone meeting with patient's brother, he decided to proceed with DNR and continue medical care. DNR orders were written   Labs   CBC: Recent Labs  Lab 08/05/21 0537 08/05/21 0737 08/05/21 0751 08/06/21 0736 08/07/21 0321 08/08/21 1205  WBC 12.5* 15.0*  --  20.9* 15.2* 8.3  NEUTROABS  --  11.8*  --   --   --  6.6  HGB 15.4 15.9 17.0  17.3* 13.0 10.4* 9.8*  HCT 49.5 53.1* 50.0  51.0 39.4 33.4* 31.7*  MCV 89.4 93.8  --  87.0 90.0 92.2  PLT 81* 83*  --  39* 29* 28*     Basic Metabolic Panel: Recent Labs  Lab 08/05/21 0537 08/05/21 0737 08/05/21 0751 08/05/21 1443 08/06/21 0438 08/07/21 0321 08/07/21 1753  NA 136 137 135  135 134* 135 135 140  K 5.2* 5.4* 5.4*  5.4* 3.6 2.9* 4.1 4.3  CL 105 102 106 101 106 111 113*  CO2 20* 20*  --  20* 19* 19* 23  GLUCOSE 263* 244* 245* 284* 378* 345* 204*  BUN 64* 63* 68* 58* 39* 32* 28*  CREATININE 0.61 0.72 0.60* 0.55* 0.43* 0.31* <0.30*  CALCIUM 8.1* 8.4*  --  7.7* 8.4* 8.1* 8.2*  MG 2.0  --   --   --  1.8 2.0 1.7  PHOS  --   --   --   --   --  1.3* 1.9*    GFR: CrCl cannot be calculated (This lab value cannot be used to calculate CrCl because it is not a number: <0.30). Recent Labs  Lab 08/05/21 0537 08/05/21 0737 08/05/21 0754 08/05/21 0952 08/05/21 1443 08/06/21 0438 08/06/21 0736 08/06/21 1015 08/07/21 0321 08/08/21 1205  PROCALCITON  --   --   --   --  1.10 0.89  --   --  0.55  --   WBC 12.5* 15.0*  --   --   --   --  20.9*  --  15.2* 8.3  LATICACIDVEN 2.6*  --  5.0* 4.2*  --   --   --  2.8*  --   --      Liver Function Tests: Recent Labs  Lab 08/05/21 0537 08/05/21 0737 08/05/21 1443 08/06/21 0438  AST 76* 86* 59* 31  ALT 79* 91* 82* 65*  ALKPHOS 112 123 105 87  BILITOT 1.2 1.4* 1.4* 1.0  PROT 5.7* 6.0* 4.9* 4.4*  ALBUMIN 3.2* 3.4* 2.8* 2.4*    No results for input(s):  LIPASE, AMYLASE in the last 168 hours. Recent Labs  Lab 08/05/21 0537  AMMONIA 44*     ABG    Component Value Date/Time   PHART 7.443 08/05/2021 0525   PCO2ART 29.5 (L) 08/05/2021 0525   PO2ART 134 (H) 08/05/2021 0525   HCO3 21.3 08/05/2021 0751   TCO2 21 (L) 08/05/2021 0751   TCO2 22 08/05/2021 0751   ACIDBASEDEF 4.0 (H) 08/05/2021 0751   O2SAT 93.0 08/05/2021 0751      Coagulation Profile: Recent Labs  Lab 08/05/21 0537 08/05/21 0737 08/06/21 0438 08/07/21 0321 08/08/21 0410  INR 2.0* 2.0* 2.5* 2.2* 1.3*     Cardiac Enzymes: Recent Labs  Lab 08/05/21 0537  08/05/21 1443  CKTOTAL 46* 85  CKMB 2.8  --      HbA1C: Hgb A1c MFr Bld  Date/Time Value Ref Range Status  08/05/2021 02:43 PM 6.2 (H) 4.8 - 5.6 % Final    Comment:    (NOTE) Pre diabetes:          5.7%-6.4%  Diabetes:              >6.4%  Glycemic control for   <7.0% adults with diabetes   07/10/2021 07:04 AM 6.2 (H) 4.8 - 5.6 % Final    Comment:    (NOTE) Pre diabetes:          5.7%-6.4%  Diabetes:              >6.4%  Glycemic control for   <7.0% adults with diabetes     CBG: Recent Labs  Lab 08/07/21 1937 08/08/21 0001 08/08/21 0411 08/08/21 0712 08/08/21 1109  GLUCAP 166* 112* 167* 145* 118*    CRITICAL CARE Performed by: Lynnell Catalan   Total critical care time: 35 minutes  Critical care time was exclusive of separately billable procedures and treating other patients.  Critical care was necessary to treat or prevent imminent or life-threatening deterioration.  Critical care was time spent personally by me on the following activities: development of treatment plan with patient and/or surrogate as well as nursing, discussions with consultants, evaluation of patient's response to treatment, examination of patient, obtaining history from patient or surrogate, ordering and performing treatments and interventions, ordering and review of laboratory studies, ordering and review  of radiographic studies, pulse oximetry, re-evaluation of patient's condition and participation in multidisciplinary rounds.  Lynnell Catalan, MD [facility] ICU Physician [facility]  Critical Care  Pager: [phone] Mobile: [phone] After hours: [phone].

## 2021-08-08 NOTE — Progress Notes (Signed)
STROKE TEAM PROGRESS NOTE   SUBJECTIVE (INTERVAL HISTORY) No family is at the bedside.  Patient lethargic with eye closed, but able to open on voice. Follow simple central commands, still has quadriplegia. MRI brain no acute stroke. MRI C-spine showed myelomalacia, no new finding. Still difficulty to explain pt acute left arm weakness. Pt being transferred back to [facility] today.    OBJECTIVE Temp:  [97.7 F (36.5 C)-98.3 F (36.8 C)] 97.7 F (36.5 C) (09/19 1100) Pulse Rate:  [43-75] 55 (09/19 1200) Cardiac Rhythm: Normal sinus rhythm;Sinus bradycardia (09/19 1200) Resp:  [19-32] 20 (09/19 1200) BP: (83-167)/(46-87) 102/56 (09/19 1200) SpO2:  [90 %-100 %] 100 % (09/19 1200) FiO2 (%):  [30 %-100 %] 35 % (09/19 1200) Weight:  [61.5 kg] 61.5 kg (09/19 0600)  Recent Labs  Lab 08/07/21 1937 08/08/21 0001 08/08/21 0411 08/08/21 0712 08/08/21 1109  GLUCAP 166* 112* 167* 145* 118*   Recent Labs  Lab 08/05/21 0537 08/05/21 0737 08/05/21 0751 08/05/21 1443 08/06/21 0438 08/07/21 0321 08/07/21 1753  NA 136 137 135  135 134* 135 135 140  K 5.2* 5.4* 5.4*  5.4* 3.6 2.9* 4.1 4.3  CL 105 102 106 101 106 111 113*  CO2 20* 20*  --  20* 19* 19* 23  GLUCOSE 263* 244* 245* 284* 378* 345* 204*  BUN 64* 63* 68* 58* 39* 32* 28*  CREATININE 0.61 0.72 0.60* 0.55* 0.43* 0.31* <0.30*  CALCIUM 8.1* 8.4*  --  7.7* 8.4* 8.1* 8.2*  MG 2.0  --   --   --  1.8 2.0 1.7  PHOS  --   --   --   --   --  1.3* 1.9*   Recent Labs  Lab 08/05/21 0537 08/05/21 0737 08/05/21 1443 08/06/21 0438  AST 76* 86* 59* 31  ALT 79* 91* 82* 65*  ALKPHOS 112 123 105 87  BILITOT 1.2 1.4* 1.4* 1.0  PROT 5.7* 6.0* 4.9* 4.4*  ALBUMIN 3.2* 3.4* 2.8* 2.4*   Recent Labs  Lab 08/05/21 0537 08/05/21 0737 08/05/21 0751 08/06/21 0736 08/07/21 0321 08/08/21 1205  WBC 12.5* 15.0*  --  20.9* 15.2* 8.3  NEUTROABS  --  11.8*  --   --   --  PENDING  HGB 15.4 15.9 17.0  17.3* 13.0 10.4* 9.8*  HCT 49.5 53.1* 50.0   51.0 39.4 33.4* 31.7*  MCV 89.4 93.8  --  87.0 90.0 92.2  PLT 81* 83*  --  39* 29* 28*   Recent Labs  Lab 08/05/21 0537 08/05/21 1443  CKTOTAL 46* 85  CKMB 2.8  --    Recent Labs    08/06/21 0438 08/07/21 0321 08/08/21 0410  LABPROT 26.9* 24.6* 16.4*  INR 2.5* 2.2* 1.3*   No results for input(s): COLORURINE, LABSPEC, PHURINE, GLUCOSEU, HGBUR, BILIRUBINUR, KETONESUR, PROTEINUR, UROBILINOGEN, NITRITE, LEUKOCYTESUR in the last 72 hours.  Invalid input(s): APPERANCEUR      Component Value Date/Time   CHOL 95 08/08/2021 0410   TRIG 136 08/08/2021 0410   HDL 25 (L) 08/08/2021 0410   CHOLHDL 3.8 08/08/2021 0410   VLDL 27 08/08/2021 0410   LDLCALC 43 08/08/2021 0410   Lab Results  Component Value Date   HGBA1C 6.2 (H) 08/05/2021   No results found for: LABOPIA, COCAINSCRNUR, LABBENZ, AMPHETMU, THCU, LABBARB  No results for input(s): ETH in the last 168 hours.  I have personally reviewed the radiological images below and agree with the radiology interpretations.  CT HEAD WO CONTRAST ( )  Result Date:  08/06/2021 CLINICAL DATA:  Stroke follow-up. EXAM: CT HEAD WITHOUT CONTRAST TECHNIQUE: Contiguous axial images were obtained from the base of the skull through the vertex without intravenous contrast. COMPARISON:  08/05/2021 FINDINGS: Brain: A small band like region of hypodensity in the left basal ganglia is unchanged and consistent with an age indeterminate but potentially acute lateral lenticulostriate territory infarct. No new infarct, intracranial hemorrhage, mass, midline shift, or extra-axial fluid collection is identified. The ventricles are normal in size. Vascular: Calcified atherosclerosis at the skull base. No hyperdense vessel. Skull: No fracture or suspicious osseous lesion. Sinuses/Orbits: Visualized paranasal sinuses and mastoid air cells are clear. Unremarkable included orbits. Other: None. IMPRESSION: 1. Unchanged, potentially acute left basal ganglia infarct. 2. No  evidence of new intracranial abnormality. Electronically Signed   By: Sebastian Ache M.D.   On: 08/06/2021 15:00   MR BRAIN WO CONTRAST  Result Date: 08/07/2021 CLINICAL DATA:  Demyelinating disease EXAM: MRI HEAD WITHOUT CONTRAST MRI CERVICAL SPINE WITHOUT CONTRAST TECHNIQUE: Multiplanar, multiecho pulse sequences of the brain and surrounding structures, and cervical spine, to include the craniocervical junction and cervicothoracic junction, were obtained without intravenous contrast. COMPARISON:  None. FINDINGS: MRI HEAD FINDINGS Brain: No acute infarct, mass effect or extra-axial collection. No acute or chronic hemorrhage. Old infarct of the left basal ganglia. Brain volume is normal. The midline structures are normal. Vascular: Major flow voids are preserved. Skull and upper cervical spine: Normal calvarium and skull base. Visualized upper cervical spine and soft tissues are normal. Sinuses/Orbits:No paranasal sinus fluid levels or advanced mucosal thickening. No mastoid or middle ear effusion. Normal orbits. MRI CERVICAL SPINE FINDINGS Alignment: Physiologic. Vertebrae: No fracture, evidence of discitis, or bone lesion. Cord: There is a large area of hyperintense T2-weighted signal at the C5-6 level. Posterior Fossa, vertebral arteries, paraspinal tissues: Negative. Disc levels: C1-2: Unremarkable. C2-3: Mild disc bulge. There is no spinal canal stenosis. No neural foraminal stenosis. C3-4: Small disc bulge with bilateral uncovertebral hypertrophy . No spinal canal stenosis. Mild bilateral neural foraminal stenosis. C4-5: Small disc bulge with bilateral uncovertebral hypertrophy. Moderate spinal canal stenosis. Moderate bilateral neural foraminal stenosis. C5-6: Large disc bulge with mild uncovertebral spurring. Severe spinal canal stenosis. There is mild deformity of the spinal cord. Severe right and moderate left neural foraminal stenosis. C6-7: Small central disc protrusion. There is no spinal canal  stenosis. No neural foraminal stenosis. C7-T1: Normal disc space and facet joints. There is no spinal canal stenosis. No neural foraminal stenosis. IMPRESSION: 1. No acute intracranial abnormality. Old infarct of the left basal ganglia. 2. Severe spinal canal stenosis at C5-6 with mild deformity of the spinal area of hyperintense T2-weighted signal at this level is likely myelomalacia. 3. Moderate spinal canal stenosis at C4-5. 4. Moderate bilateral C4-5 and left C5-6 neural foraminal stenosis. Electronically Signed   By: Deatra Robinson M.D.   On: 08/07/2021 23:55   MR CERVICAL SPINE WO CONTRAST  Result Date: 08/07/2021 CLINICAL DATA:  Demyelinating disease EXAM: MRI HEAD WITHOUT CONTRAST MRI CERVICAL SPINE WITHOUT CONTRAST TECHNIQUE: Multiplanar, multiecho pulse sequences of the brain and surrounding structures, and cervical spine, to include the craniocervical junction and cervicothoracic junction, were obtained without intravenous contrast. COMPARISON:  None. FINDINGS: MRI HEAD FINDINGS Brain: No acute infarct, mass effect or extra-axial collection. No acute or chronic hemorrhage. Old infarct of the left basal ganglia. Brain volume is normal. The midline structures are normal. Vascular: Major flow voids are preserved. Skull and upper cervical spine: Normal calvarium and skull base. Visualized  upper cervical spine and soft tissues are normal. Sinuses/Orbits:No paranasal sinus fluid levels or advanced mucosal thickening. No mastoid or middle ear effusion. Normal orbits. MRI CERVICAL SPINE FINDINGS Alignment: Physiologic. Vertebrae: No fracture, evidence of discitis, or bone lesion. Cord: There is a large area of hyperintense T2-weighted signal at the C5-6 level. Posterior Fossa, vertebral arteries, paraspinal tissues: Negative. Disc levels: C1-2: Unremarkable. C2-3: Mild disc bulge. There is no spinal canal stenosis. No neural foraminal stenosis. C3-4: Small disc bulge with bilateral uncovertebral hypertrophy .  No spinal canal stenosis. Mild bilateral neural foraminal stenosis. C4-5: Small disc bulge with bilateral uncovertebral hypertrophy. Moderate spinal canal stenosis. Moderate bilateral neural foraminal stenosis. C5-6: Large disc bulge with mild uncovertebral spurring. Severe spinal canal stenosis. There is mild deformity of the spinal cord. Severe right and moderate left neural foraminal stenosis. C6-7: Small central disc protrusion. There is no spinal canal stenosis. No neural foraminal stenosis. C7-T1: Normal disc space and facet joints. There is no spinal canal stenosis. No neural foraminal stenosis. IMPRESSION: 1. No acute intracranial abnormality. Old infarct of the left basal ganglia. 2. Severe spinal canal stenosis at C5-6 with mild deformity of the spinal area of hyperintense T2-weighted signal at this level is likely myelomalacia. 3. Moderate spinal canal stenosis at C4-5. 4. Moderate bilateral C4-5 and left C5-6 neural foraminal stenosis. Electronically Signed   By: Deatra Robinson M.D.   On: 08/07/2021 23:55   DG CHEST PORT 1 VIEW  Result Date: 08/05/2021 CLINICAL DATA:  Central line placement EXAM: PORTABLE CHEST - 1 VIEW COMPARISON:  Earlier film of the same day FINDINGS: Left subclavian central venous catheter extends to the proximal SVC. No pneumothorax. Lungs are clear. The right apex is partially excluded. Tracheostomy device stable in position. Heart size and mediastinal contours are within normal limits. No effusion. Vertebral endplate spurring at multiple levels in the mid and lower thoracic spine. IMPRESSION: 1. Central line to  the proximal SVC without pneumothorax. Electronically Signed   By: Corlis Leak M.D.   On: 08/05/2021 17:04   DG Chest Port 1 View  Result Date: 08/05/2021 CLINICAL DATA:  Questionable sepsis EXAM: PORTABLE CHEST 1 VIEW COMPARISON:  Yesterday FINDINGS: Tracheostomy tube in place. There is no edema, consolidation, effusion, or pneumothorax. Normal heart size and  mediastinal contours. Less well demonstrated pneumomediastinum due to rotation and possible decreased. IMPRESSION: Stable chest. Electronically Signed   By: Tiburcio Pea M.D.   On: 08/05/2021 08:31   DG CHEST PORT 1 VIEW  Result Date: 08/04/2021 CLINICAL DATA:  Fever EXAM: PORTABLE CHEST 1 VIEW COMPARISON:  07/27/2021 FINDINGS: Persistent small pneumomediastinum. No residual pneumothorax. Unchanged position of tracheostomy tube. Lungs are clear. No pleural effusion. IMPRESSION: Persistent pneumomediastinum. No residual pneumothorax. Electronically Signed   By: Deatra Robinson M.D.   On: 08/04/2021 23:24   DG Chest Port 1 View  Result Date: 07/27/2021 CLINICAL DATA:  55 year old male with mild pneumothorax and pneumomediastinum. Respiratory failure, sepsis, transverse myelitis. EXAM: PORTABLE CHEST 1 VIEW COMPARISON:  Portable chest 07/26/2021 and earlier. FINDINGS: Portable AP semi upright view at 0604 hours. The patient is now rotated to the right. Tracheostomy and right PICC line are stable. Stable cardiac size and mediastinal contours. Stable lung volumes. Small volume right chest wall subcutaneous gas is stable. Streaky left supraclavicular subcutaneous gas also appears stable. Small volume pneumomediastinum appears stable. Trace apical pneumothoraces were better demonstrated yesterday but stable. No new pulmonary opacity.  Stable visualized osseous structures. IMPRESSION: 1.  Stable lines and tubes.  Stable ventilation. 2. Stable trace bilateral pneumothoraces, small volume pneumomediastinum, and small volume bilateral chest wall and neck subcutaneous emphysema. Electronically Signed   By: Odessa Fleming M.D.   On: 07/27/2021 06:29   DG CHEST PORT 1 VIEW  Result Date: 07/26/2021 CLINICAL DATA:  PICC placement. EXAM: PORTABLE CHEST 1 VIEW COMPARISON:  Same day. FINDINGS: Stable cardiomediastinal silhouette. Tracheostomy tube is unchanged in position. Interval placement of right-sided PICC line with distal  tip in expected position of the SVC. Stable bilateral minimal apical pneumothoraces. Stable pneumomediastinum is noted. Subcutaneous emphysema is seen over right lateral chest wall as well as in both supraclavicular regions. Bony thorax is unremarkable. IMPRESSION: Interval placement of right-sided PICC line with distal tip in expected position of the SVC. Stable minimal bilateral apical pneumothoraces and pneumomediastinum is noted. Electronically Signed   By: Lupita Raider M.D.   On: 07/26/2021 14:51   DG Chest Port 1 View  Result Date: 07/26/2021 CLINICAL DATA:  Pneumonia. EXAM: PORTABLE CHEST 1 VIEW COMPARISON:  Chest x-ray dated July 23, 2021. FINDINGS: Unchanged tracheostomy tube. Normal heart size. Slightly increased pneumomediastinum. New trace bilateral apical pneumothoraces, not clearly seen on the prior study. Resolved airspace disease in the right lung. No pleural effusion. No acute osseous abnormality. IMPRESSION: 1. Slightly increased pneumomediastinum with new trace bilateral apical pneumothoraces. 2. Resolved right lung pneumonia. Electronically Signed   By: Obie Dredge M.D.   On: 07/26/2021 06:18   DG Chest Port 1 View  Result Date: 07/23/2021 CLINICAL DATA:  Respiratory failure EXAM: PORTABLE CHEST 1 VIEW COMPARISON:  07/20/2021 FINDINGS: Tracheostomy is unchanged. New airspace disease throughout the right lung, most confluent in the right upper lobe. Medial left basilar atelectasis. No effusions. No acute bony abnormality. Heart is normal size. IMPRESSION: Airspace disease throughout the right lung, most confluent in the right upper lobe concerning for pneumonia. Medial basilar atelectasis on the left. Electronically Signed   By: Charlett Nose M.D.   On: 07/23/2021 01:35   DG CHEST PORT 1 VIEW  Result Date: 07/20/2021 CLINICAL DATA:  Respiratory failure (HCC) J96.90 (ICD-10-CM) EXAM: PORTABLE CHEST 1 VIEW COMPARISON:  07/01/2021. FINDINGS: New airspace opacities in the lateral  left lung base. Tracheostomy tube tip projects near the midline at the thoracic inlet with slight patient rotation and lordotic positioning. No visible pleural effusion or pneumothorax on this single semi erect radiograph. Cardiomediastinal silhouette is within normal limits and similar to prior. IMPRESSION: New airspace opacities in the lateral left lung base, concerning for aspiration and/or pneumonia. Recommend follow-up imaging to resolution. Electronically Signed   By: Feliberto Harts M.D.   On: 07/20/2021 09:20   DG Chest Port 1 View  Result Date: 07/11/2021 CLINICAL DATA:  Elevated white blood cell count EXAM: PORTABLE CHEST 1 VIEW COMPARISON:  07/01/2021 FINDINGS: Frontal view of the chest was obtained. The patient is markedly rotated to the left at the time of imaging, severely limited the evaluation. Please note that the image is mislabeled by the technologist, as previous exams have demonstrated an elevated left hemidiaphragm. The cardiac silhouette is grossly unremarkable. No airspace disease, effusion, or pneumothorax. No acute bony abnormality. Tracheostomy tube unchanged. IMPRESSION: 1. Severely limited evaluation due to patient positioning. 2. No gross airspace disease. Electronically Signed   By: Sharlet Salina M.D.   On: 07/11/2021 17:14   DG Abd Portable 1V  Result Date: 08/07/2021 CLINICAL DATA:  MRI clearance EXAM: PORTABLE ABDOMEN - 1 VIEW COMPARISON:  07/01/2021 abdomen radiograph FINDINGS: Bladder probe  overlies the pelvis. Surgical clip in the right lower quadrant. No other radiopaque object. The bowel gas pattern is normal. Supine technique limits evaluation for intraperitoneal free air. Imaged lung bases are unremarkable. No acute osseous abnormality. IMPRESSION: Nonobstructive bowel-gas pattern. Surgical clip in the right lower quadrant. No other metallic object seen. Electronically Signed   By: Wiliam Ke M.D.   On: 08/07/2021 19:20   EEG adult  Result Date:  08/05/2021 Charlsie Quest, MD     08/05/2021 10:51 PM Patient Name: Rubert Frediani MRN: [id number] Epilepsy Attending: Charlsie Quest Referring Physician/Provider: Pia Mau, PA Date: 08/05/2021 Duration: 25.54 mins Patient history: 54yo M with ams. EEG to evaluate for seizure Level of alertness: lethargic AEDs during EEG study: None Technical aspects: This EEG study was done with scalp electrodes positioned according to the 10-20 International system of electrode placement. Electrical activity was acquired at a sampling rate of 500Hz  and reviewed with a high frequency filter of 70Hz  and a low frequency filter of 1Hz . EEG data were recorded continuously and digitally stored. Description: EEG showed continuous generalized 3 to 5 Hz theta-delta slowing. Hyperventilation and photic stimulation were not performed.   ABNORMALITY - Continuous slow, generalized IMPRESSION: This study is suggestive of moderate diffuse encephalopathy, nonspecific etiology. No seizures or epileptiform discharges were seen throughout the recording.   CT HEAD CODE STROKE WO CONTRAST  Result Date: 08/05/2021 CLINICAL DATA:  Code stroke.  Right-sided gaze and aphasia EXAM: CT HEAD WITHOUT CONTRAST TECHNIQUE: Contiguous axial images were obtained from the base of the skull through the vertex without intravenous contrast. COMPARISON:  None. FINDINGS: Brain: Band of indistinct low-density at the left basal ganglia and corona radiata. No hemorrhage, hydrocephalus, or masslike finding. Vascular: No hyperdense vessel. Skull: Normal. Negative for fracture or focal lesion. Sinuses/Orbits: Negative Other: Motion artifact. ASPECTS [facility] Stroke Program Early CT Score) - Ganglionic level infarction (caudate, lentiform nuclei, internal capsule, insula, M1-M3 cortex): 6 - Supraganglionic infarction (M4-M6 cortex): 3 Total score (0-10 with 10 being normal): 9 IMPRESSION: 1. Potentially acute perforator infarct at the left basal  ganglia and corona radiata. 2. No acute hemorrhage 3. ASPECTS is 9 4. Motion artifact. Electronically Signed   By: Charlsie Quest M.D.   On: 08/05/2021 07:28      PHYSICAL EXAM  Temp:  [97.7 F (36.5 C)-98.3 F (36.8 C)] 97.7 F (36.5 C) (09/19 1100) Pulse Rate:  [43-75] 55 (09/19 1200) Resp:  [19-32] 20 (09/19 1200) BP: (83-167)/(46-87) 102/56 (09/19 1200) SpO2:  [90 %-100 %] 100 % (09/19 1200) FiO2 (%):  [30 %-100 %] 35 % (09/19 1200) Weight:  [61.5 kg] 61.5 kg (09/19 0600)  General - Cachectic, well developed, status post tracheostomy on trach collar  Ophthalmologic - fundi not visualized due to noncooperation.  Cardiovascular - Regular rhythm and rate.  Neuro - lethargic, eyes closed but eyes open on voice, status post tracheostomy on trach collar, nonverbal however, follow midline commands.  Able to cross midline bilaterally but bilateral horizontal gaze incomplete.  Not blinking to be described bilaterally, PERRL.  Bilateral facial weakness. Tongue midline.  Left upper extremity flaccid, right upper extremity contracture at elbow, right hand slight movement spontaneously.  Bilateral lower extremity in rehab boot, no movement, left lower extremity flaccid, right lower extremity some muscle tone at knee level.  DTR diminished throughout, no Babinski bilaterally. Sensation, coordination and gait not tested.   ASSESSMENT/PLAN Mr. Dominic Benjamin is a 55 y.o. 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 No tPA given due to INR 2.0.    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 Denese Killings, family is considering comfort care measures.  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 Status post trach and PEG Management in [facility] PTA MRI C-spine 08/07/21 Severe spinal canal stenosis at C5-6 with mild deformity of the spinal area of hyperintense T2-weighted signal at this level is likely myelomalacia  History of stroke Lack of details 05/28/2011 left BG infarct on MRI Per note, residual right-sided weakness after stroke Current CT x2 showed left BG infarct MRI brain no acute infarct, old left BG infarct  Thrombocytopenia Platelet 83-39-29->28 Hold off antiplatelet at this time  Sepsis, septic shock Leukocytosis Fever (resolved) WBC 15.0-20.9-15.2-8.3 On cefepime CCM on board  Diabetes HgbA1c 6.2, goal < 7.0 Continued hyperglycemia Currently on Levemir CBG monitoring SSI  Hypotension, improved stable on the low end Currently off pressors On midodrine 20mg  tid  Hyperlipidemia Home meds: Lipitor 40 LDL 43, goal < 70 Now on Lipitor 40 Continue statin at discharge  Other Stroke Risk Factors   Other Active Problems History of DVT, on Coumadin PTA, now off History of recent COVID  Hospital day # 3  Neurology will sign off. Please call with questions. Thanks for the consult.   Marvel Plan, MD PhD Stroke Neurology 08/08/2021 12:54 PM    To contact Stroke Continuity provider, please refer to [email or web address]. After hours, contact General Neurology

## 2021-08-08 NOTE — Progress Notes (Signed)
Daily Progress Note   Patient Name: Dominic Benjamin       Date: 08/08/2021 DOB: 09/09/1966  Age: 55 y.o. MRN#: [id number] Attending Physician: Cheri Fowler, MD Primary Care Physician: Rica Mast Admit Date: 08/05/2021  Reason for Consultation/Follow-up: goals of care  Subjective: Chart reviewed. Plan was for discharge back to [facility] today; however this has been delayed due to patient's platelets are low at 28.  Patient has tolerated trach collar since yesterday.   16:00 - I spoke with brother Tasia Catchings by phone. He has just arrived in [place]. He plans to visit patient tomorrow. Continued discussion was had regarding possible transition to comfort care. Provided education that if patient continues to tolerate trach collar, he will not have to make the difficult decision to remove ventilator support. However, he would need to make the decision not to resume ventilator support.  The concept of comfort care was reviewed, emphasizing that the goal is comfort and dignity rather than prolonging life. Discussed transition to comfort care in house and what that would look like--keeping him clean and dry, no labs, no artificial hydration or feeding, no antibiotics, minimizing of medications, comfort feeds, medication for pain and dyspnea.  Emphasized that life prolonging interventions such as artificial feeding and hydration would be stopped. Discussed that stopping artificial feeding does not cause someone to pass, it simply allows for natural death that would have occurred earlier due to patient's medical condition. Discussed that from an ethical standpoint, stopping artificial feeding is the same as not starting it in the first place, but emotionally it can be more difficult.  Additional  discussion was had regarding possible option to transfer to residential hospice for a more peaceful setting for EOL care.   Discussed that if decision is made to continue full scope treatment, patient would discharge back to [facility] when medically stable.  Provided education that even with excellent care, infections are often a recurrent issue for patients with quadriparesis.   We also discussed the alternative option of removal of ventilator support with decision not to re-intubate. This would likely be terminal. In this case, we  Discussed that medication would be given to promote comfort and prevent pain or dyspnea.   Created space and opportunity for Tasia Catchings to express thoughts and feelings regarding patient's current medical situation. He has  not seen his brother in 2 years. Discussed that it may be difficult tomorrow to see patient in his current condition. Emotional support provided    Length of Stay: 3   Vital Signs: BP (!) 102/56 (BP Location: Left Arm)   Pulse (!) 55   Temp 97.7 F (36.5 C) (Oral)   Resp 20   Ht 5\' 7"  (1.702 m)   Wt 61.5 kg   SpO2 100%   BMI 21.24 kg/m  SpO2: SpO2: 100 % O2 Device: O2 Device: Tracheostomy Collar O2 Flow Rate: O2 Flow Rate (L/min): 8 L/min  Intake/output summary:  Intake/Output Summary (Last 24 hours) at 08/08/2021 1422 Last data filed at 08/08/2021 1215 Gross per 24 hour  Intake 2606.39 ml  Output 1840 ml  Net 766.39 ml   LBM: Last BM Date: 08/07/21 Baseline Weight: Weight: 65 kg Most recent weight: Weight: 61.5 kg       Palliative Assessment/Data: PPS 30%      Palliative Care Assessment & Plan   HPI/Patient Profile: 55 y.o. 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 He presented to the emergency department on  08/05/2021 with altered mental status and right-sided weakness. He was found to be in septic shock with high fevers, hypotension, and lactic acidosis. Started on vasopressor therapy. Source of sepsis likely UTI versus sacral wound.  CT head showed possible acute left basal ganglia stroke. Admitted to PCCM.    Patient was initially hospitalized at  [facility] 05/14/21 - 05/24/21 with acute renal failure. Several days after admission he started having lower extremity weakness. MRI 05/27/21 showed abnormal spinal cord signal concerning for transverse myelitis.  He was hospitalized at [facility] 05/25/21 - 07/01/21, where he was intubated and later underwent tracheostomy and PEG placement. He was transferred to [facility] on 07/01/21.    Assessment: - septic shock, resolved - Probable infected unstageable sacral wound - acute left basal ganglia stroke - acute on chronic respiratory with hypoxia - chronic trach, vent dependent - quadriparesis due to transverse myelitis  Recommendations/Plan: DNR as previously documented Continue current interventions PMT will meet with brother tomorrow for continued discussion around comfort care  Goals of Care and Additional Recommendations: Limitations on Scope of Treatment: Full Scope Treatment  Code Status: DNR/DNI  Prognosis:  Poor overall  Discharge Planning: To Be Determined    Thank you for allowing the Palliative Medicine Team to assist in the care of this patient.   Total Time 35 minutes Prolonged Time Billed  no       Greater than 50%  of this time was spent counseling and coordinating care related to the above assessment and plan.  08/31/21, NP  Please contact Palliative Medicine Team phone at [phone] for questions and concerns.

## 2021-08-09 DIAGNOSIS — Z66 Do not resuscitate: Secondary | ICD-10-CM

## 2021-08-09 DIAGNOSIS — Z789 Other specified health status: Secondary | ICD-10-CM

## 2021-08-09 LAB — CBC WITH DIFFERENTIAL/PLATELET
Abs Immature Granulocytes: 0.07 10*3/uL (ref 0.00–0.07)
Basophils Absolute: 0 10*3/uL (ref 0.0–0.1)
Basophils Relative: 0 %
Eosinophils Absolute: 0.1 10*3/uL (ref 0.0–0.5)
Eosinophils Relative: 1 %
HCT: 32.6 % — ABNORMAL LOW (ref 39.0–52.0)
Hemoglobin: 10.1 g/dL — ABNORMAL LOW (ref 13.0–17.0)
Immature Granulocytes: 1 %
Lymphocytes Relative: 16 %
Lymphs Abs: 1.1 10*3/uL (ref 0.7–4.0)
MCH: 28.1 pg (ref 26.0–34.0)
MCHC: 31 g/dL (ref 30.0–36.0)
MCV: 90.6 fL (ref 80.0–100.0)
Monocytes Absolute: 0.5 10*3/uL (ref 0.1–1.0)
Monocytes Relative: 7 %
Neutro Abs: 4.8 10*3/uL (ref 1.7–7.7)
Neutrophils Relative %: 75 %
Platelets: 32 10*3/uL — ABNORMAL LOW (ref 150–400)
RBC: 3.6 MIL/uL — ABNORMAL LOW (ref 4.22–5.81)
RDW: 19.5 % — ABNORMAL HIGH (ref 11.5–15.5)
WBC: 6.5 10*3/uL (ref 4.0–10.5)
nRBC: 0 % (ref 0.0–0.2)

## 2021-08-09 LAB — GLUCOSE, CAPILLARY
Glucose-Capillary: 119 mg/dL — ABNORMAL HIGH (ref 70–99)
Glucose-Capillary: 120 mg/dL — ABNORMAL HIGH (ref 70–99)
Glucose-Capillary: 124 mg/dL — ABNORMAL HIGH (ref 70–99)
Glucose-Capillary: 133 mg/dL — ABNORMAL HIGH (ref 70–99)
Glucose-Capillary: 148 mg/dL — ABNORMAL HIGH (ref 70–99)
Glucose-Capillary: 166 mg/dL — ABNORMAL HIGH (ref 70–99)

## 2021-08-09 LAB — PROTIME-INR
INR: 1.1 (ref 0.8–1.2)
Prothrombin Time: 14 seconds (ref 11.4–15.2)

## 2021-08-09 MED ORDER — NUTRISOURCE FIBER PO PACK: 1.0000 | PACK | Freq: Two times a day (BID) | ORAL | 0 refills | Status: AC

## 2021-08-09 MED ORDER — MIDODRINE HCL 5 MG PO TABS
15.0000 mg | ORAL_TABLET | Freq: Three times a day (TID) | ORAL | Status: DC
  Administered 2021-08-09 – 2021-08-10 (×3): 15 mg
  Filled 2021-08-09 (×3): qty 3

## 2021-08-09 MED ORDER — NUTRISOURCE FIBER PO PACK
1.0000 | PACK | Freq: Two times a day (BID) | ORAL | Status: DC
  Administered 2021-08-09 – 2021-08-10 (×3): 1
  Filled 2021-08-09 (×4): qty 1

## 2021-08-09 MED ORDER — MIDODRINE HCL 5 MG PO TABS: 15.0000 mg | ORAL_TABLET | Freq: Three times a day (TID) | ORAL | 0 refills | Status: AC

## 2021-08-09 NOTE — Progress Notes (Addendum)
Daily Progress Note   Patient Name: Dominic Benjamin       Date: 08/09/2021 DOB: [birth year]  Age: 55 y.o. MRN#: [id number] Attending Physician: Jacky Kindle, MD Primary Care Physician: Hollie Beach Admit Date: 08/05/2021  Reason for Consultation/Follow-up: Disposition and Establishing goals of care  Subjective: Chart review performed. Received report from primary RN - no acute concerns.  Went to visit patient at bedside - unable to see patient at his hydrotherapy had just been initiated. Met with patient's brother/HCPOA/Craig in 9M conference room.   Offered emotional support. Reviewed information/conversations Cecilie Lowers has gotten from PMT/medical team thus far. Cecilie Lowers tells me he was able to talk with patient upon his arrival today about his goals. Therapeutic listening provided as Cecilie Lowers outlines the conversation he and the patient had - because patient is not able to speak, they communicated through patient blinking his eyes, 1 for yes, 2 for no. Per Craig's review, he outlined current situation and expectations for quality of life going forward very well for patient. Cecilie Lowers has a good understanding of patient's current medical situation. Per patient's answers to Cecilie Lowers, he does not want to stop current care, including tube feedings. I inquired if Cecilie Lowers felt patient was able to understand and make complex medical decisions such as these - he does feel patient understood conversation and can make these decisions. Cecilie Lowers expressed that he has spoken with other family - he and family members feel comfort care is best at this time as they would "not want to live this way"; however, Cecilie Lowers does not want to go against patient's wishes. Reviewed that there will likely be a time coming soon when patient  will no longer be able to make his own decisions and the medical team would look to [employer] for [place] understands and states that when that time comes, he would choose comfort care for patient. Reviewed with Cecilie Lowers that without hospice care, patient remains at high risk for rehospitalization, likely for recurrent infections - Cecilie Lowers expressed understanding and states he also anticipates this.   Explained and offered outpatient Palliative Care services - Cecilie Lowers was agreeable. Education provided that outpatient Palliative Care could transition the patient to hospice services if/when family/patient were ready - patient would not have to return to hospital.   Verified DNR/DNI status.   Therapeutic  listening provided as Cecilie Lowers describes how much patient valued his independence - reviewed patient's volunteer services with [employer] and [employer] as well as how patient did not want to move into an assisted living facility or move closer to family in [place]. Cecilie Lowers and patient understand that patient will no longer be able to live alone and will require 24/7 care going forward.   Obtained copy of HCPOA.  All questions and concerns addressed. Encouraged to call with questions and/or concerns. PMT card provided.  Length of Stay: 4  Current Medications: Scheduled Meds:  . atorvastatin  40 mg Per Tube QHS  . chlorhexidine gluconate (MEDLINE KIT)  15 mL Mouth Rinse BID  . Chlorhexidine Gluconate Cloth  6 each Topical Daily  . collagenase   Topical Daily  . famotidine  20 mg Per Tube BID  . feeding supplement (PROSource TF)  45 mL Per Tube TID  . gabapentin  100 mg Per Tube Q12H  . insulin aspart  0-15 Units Subcutaneous Q4H  . insulin aspart  5 Units Subcutaneous Q4H  . insulin detemir  20 Units Subcutaneous BID  . mouth rinse  15 mL Mouth Rinse 10 times per day  . midodrine  20 mg Per Tube TID WC  . potassium & sodium phosphates  1 packet Per Tube TID WC & HS  . silver nitrate  applicators  1 Stick Topical Daily  . sodium chloride flush  10-40 mL Intracatheter Q12H    Continuous Infusions: . sodium chloride 10 mL/hr at 08/09/21 0500  . ceFEPime (MAXIPIME) IV 2 g (08/09/21 0749)  . feeding supplement (VITAL 1.5 CAL) 1,000 mL (08/08/21 2034)    PRN Meds: sodium chloride, acetaminophen (TYLENOL) oral liquid 160 mg/5 mL, docusate, ipratropium-albuterol, oxyCODONE, polyethylene glycol, sodium chloride flush  Physical Exam         Unable to complete - patient receiving hydrotherapy during visit  Vital Signs: BP (!) 147/68   Pulse (!) 57   Temp 97.8 F (36.6 C) (Oral)   Resp 20   Ht 5' 7"  (1.702 m)   Wt 62.3 kg   SpO2 97%   BMI 21.51 kg/m  SpO2: SpO2: 97 % O2 Device: O2 Device: Tracheostomy Collar O2 Flow Rate: O2 Flow Rate (L/min): 5 L/min  Intake/output summary:  Intake/Output Summary (Last 24 hours) at 08/09/2021 1138 Last data filed at 08/09/2021 0900 Gross per 24 hour  Intake 2257.05 ml  Output 2875 ml  Net -617.95 ml   LBM: Last BM Date: 08/09/21 Baseline Weight: Weight: 65 kg Most recent weight: Weight: 62.3 kg       Palliative Assessment/Data: PPS 30% with tube feedings      Patient Active Problem List   Diagnosis Date Noted  . Protein-calorie malnutrition, severe 08/06/2021  . Pressure injury of skin 08/06/2021  . Septic shock ([place]) 08/05/2021  . Stroke ([place]) 08/05/2021  . Acute cystitis without hematuria   . COVID   . Acute metabolic encephalopathy   . Uncontrolled type 2 diabetes mellitus with hyperglycemia ([place])   . Acute respiratory failure with hypoxia ([place])   . Tracheostomy dependence ([place])   . Acute on chronic respiratory failure with hypoxia ([place]) 07/06/2021  . Transverse myelitis ([place]) 07/06/2021  . Severe anxiety 07/06/2021  . DVT of axillary vein, acute ([place]) 07/06/2021    Palliative Care Assessment & Plan   Patient Profile: 55 y.o. male  with past medical history of idiopathic severe transverse myelitis  resulting in vent-dependent quadriparesis, GERD, anxiety,  DVT axillary vein 05/2021, DMT2 with bilateral diabetic ulcers, osteomyelitis, HTN, HLD, CVA 2012, RLS, and chronic back pain on opioids. He presented to the emergency department on 08/05/2021 with altered mental status and right-sided weakness. He was found to be in septic shock with high fevers, hypotension, and lactic acidosis. Started on vasopressor therapy. Source of sepsis likely UTI versus sacral wound.  CT head showed possible acute left basal ganglia stroke. Admitted to PCCM.    Patient was initially hospitalized at  [facility] 05/14/21 - 05/24/21 with acute renal failure. Several days after admission he started having lower extremity weakness. MRI 05/27/21 showed abnormal spinal cord signal concerning for transverse myelitis.  He was hospitalized at [facility] 05/25/21 - 07/01/21, where he was intubated and later underwent tracheostomy and PEG placement. He was transferred to [facility] on 07/01/21.   Assessment: Septic shock Probable infected unstageable sacral wound Acute left basal ganglia stroke Acute on chronic respiratory with hypoxia Chronic trach, vent dependent Quadriparesis due to transverse myelitis  Recommendations/Plan: Continue current medical treatment Continue DNR/DNI as previously documented - durable DNR form completed and placed in shadow chart. Copy was made and will be scanned into Vynca/ACP tab Family is open to comfort care and hospice; however, per their conversations with patient, patient wants to continue tube feeds/current life-prolonging care and family do not want to go against his wishes. Goal is for patient to return to [facility] consult for: outpatient Palliative Care referral Copy of HCPOA document obtained - copy made and will be scanned into Vynca/ACP tab PMT will continue to follow peripherally. If there are any imminent needs please call  the service directly   Goals of Care and Additional Recommendations: Limitations on Scope of Treatment: Full Scope Treatment  Code Status:    Code Status Orders  (From admission, onward)           Start     Ordered   08/06/21 1452  Do not attempt resuscitation (DNR)  Continuous       Question Answer Comment  In the event of cardiac or respiratory ARREST Do not call a "code blue"   In the event of cardiac or respiratory ARREST Do not perform Intubation, CPR, defibrillation or ACLS   In the event of cardiac or respiratory ARREST Use medication by any route, position, wound care, and other measures to relive pain and suffering. May use oxygen, suction and manual treatment of airway obstruction as needed for comfort.      08/06/21 1452           Code Status History     Date Active Date Inactive Code Status Order ID Comments User Context   08/05/2021 0923 08/06/2021 1452 Full Code [id number]  Candee Furbish, MD ED   07/01/2021 1612 08/05/2021 0719 Full Code [id number]  Carney Harder Inpatient       Prognosis:  Poor in the setting of permanent disabilities and multiple comorbidities  Discharge Planning: [place] for rehab with Palliative care service follow-up  Care plan was discussed with primary RN, patient's brother/HCPOA, Dr. Tacy Learn, [facility]  Thank you for allowing the Palliative Medicine Team to assist in the care of this patient.   Total Time 67 minutes Prolonged Time Billed  yes       Greater than 50%  of this time was spent counseling and coordinating care related to the above assessment and plan.  Lin Landsman, NP  Please contact  Palliative Medicine Team phone at [phone] for questions and concerns.

## 2021-08-09 NOTE — TOC Progression Note (Addendum)
Transition of Care [facility]) - Progression Note    Patient Details  Name: Dominic Benjamin MRN: [id number] Date of Birth: 04-11-1966  Transition of Care [facility]) CM/SW Contact  Okey Dupre Lazaro Arms, LCSW Phone Number: 08/09/2021, 1:19 PM  Clinical Narrative:  CSW informed by [facility] liaison Victorino Dike that they do not have a bed today for patient. Will follow-up with family.   1:25 pm: Went to patient's room and no family present. 1:31 pm: Called patient's brother Garo Heidelberg ([phone]) and message left for him to call CSW. 1:39 pm: Received call from son Tasia Catchings and updated provided regarding no bed today at [facility]. Informed son that search will have to be initiated to locate an LTAC bed for patient. Son expressed understanding.        Expected Discharge Plan and Services  LTAC facility         Expected Discharge Date: 08/09/21                                     Social Determinants of Health (SDOH) Interventions    Readmission Risk Interventions No flowsheet data found.

## 2021-08-09 NOTE — Progress Notes (Signed)
Physical Therapy Wound Treatment Patient Details  Name: Dominic Benjamin MRN: [id number] Date of Birth: May 01, 1966  Today's Date: 08/09/2021 Time: 7867-5449 Time Calculation (min): 42 min  Subjective  Subjective Assessment Subjective: Shaking head yes and no to answer questions. Patient and Family Stated Goals: pt unable to participate due to lethargy Date of Onset:  (present on admission) Prior Treatments: unknown  Pain Score:  Pt does not indicate pain throughout session.   Wound Assessment  Pressure Injury 08/05/21 Coccyx Medial Unstageable - Full thickness tissue loss in which the base of the injury is covered by slough (yellow, tan, gray, green or brown) and/or eschar (tan, brown or black) in the wound bed. eschar (Active)  Dressing Type ABD;Barrier Film (skin prep);Gauze (Comment);Moist to moist;Santyl 08/09/21 1256  Dressing Changed;Clean;Dry;Intact 08/09/21 1256  Dressing Change Frequency Daily 08/09/21 1256  State of Healing Eschar 08/09/21 1256  Site / Wound Assessment Black;Pink;Yellow 08/09/21 1256  % Wound base Red or Granulating 10% 08/09/21 1256  % Wound base Yellow/Fibrinous Exudate 40% 08/09/21 1256  % Wound base Black/Eschar 50% 08/09/21 1256  % Wound base Other/Granulation Tissue (Comment) 0% 08/09/21 1256  Peri-wound Assessment Intact;Pink;Maceration 08/09/21 1256  Wound Length (cm) 9 cm 08/06/21 0936  Wound Width (cm) 7.9 cm 08/06/21 0936  Wound Depth (cm) 0.8 cm 08/06/21 0936  Wound Surface Area (cm^2) 71.1 cm^2 08/06/21 0936  Wound Volume (cm^3) 56.88 cm^3 08/06/21 0936  Tunneling (cm) 0 08/08/21 1142  Undermining (cm) 0 08/08/21 1142  Margins Unattached edges (unapproximated) 08/09/21 1256  Drainage Amount Moderate 08/09/21 1256  Drainage Description Sanguineous 08/09/21 1256  Treatment Debridement (Selective);Hydrotherapy (Pulse lavage);Packing (Saline gauze) 08/09/21 1256      Hydrotherapy Pulsed lavage therapy - wound location: sacrum Pulsed  Lavage with Suction (psi): 8 psi Pulsed Lavage with Suction - Normal Saline Used: 1000 mL Pulsed Lavage Tip: Tip with splash shield Selective Debridement Selective Debridement - Location: sacrum Selective Debridement - Tools Used: Forceps, Scalpel Selective Debridement - Tissue Removed: unviable tissue    Wound Assessment and Plan  Wound Therapy - Assess/Plan/Recommendations Wound Therapy - Clinical Statement: Progressing with debridement. Silver nitrate utilized to control bleeding in 2 small spots. This patient will benefit from continued hydrotherapy for selective removal of unviable tissue, to decrease bioburden, and promote wound bed healing. Wound Therapy - Functional Problem List: limited turning/positioning surfaces Factors Delaying/Impairing Wound Healing: Altered sensation, Incontinence, Infection - systemic/local, Immobility, Polypharmacy Hydrotherapy Plan: Debridement, Dressing change, Patient/family education, Pulsatile lavage with suction Wound Therapy - Frequency: 6X / week Wound Therapy - Follow Up Recommendations: f/u selective debridement, dressing changes by RN  Wound Therapy Goals- Improve the function of patient's integumentary system by progressing the wound(s) through the phases of wound healing (inflammation - proliferation - remodeling) by: Wound Therapy Goals - Improve the function of patient's integumentary system by progressing the wound(s) through the phases of wound healing by: Decrease Necrotic Tissue to: 60 Decrease Necrotic Tissue - Progress: Progressing toward goal Increase Granulation Tissue to: 40 Increase Granulation Tissue - Progress: Progressing toward goal Decrease Length/Width/Depth by (cm): 0.3/0.3/NA will likely increase depth Decrease Length/Width/Depth - Progress: Progressing toward goal Goals/treatment plan/discharge plan were made with and agreed upon by patient/family: Yes Time For Goal Achievement: 7 days Wound Therapy - Potential for  Goals: Fair  Goals will be updated until maximal potential achieved or discharge criteria met.  Discharge criteria: when goals achieved, discharge from hospital, MD decision/surgical intervention, no progress towards goals, refusal/missing three consecutive treatments without notification or medical  reason.  GP     Charges PT Wound Care Charges $Wound Debridement up to 20 cm: < or equal to 20 cm $ Wound Debridement each add'l 20 sqcm: 3 $PT PLS Gun and Tip: 1 Supply $PT Hydrotherapy Visit: 1 Visit       Thelma Comp 08/09/2021, 2:00 PM  Rolinda Roan, PT, DPT Acute Rehabilitation Services Pager: [phone] Office: [phone]

## 2021-08-10 ENCOUNTER — Inpatient Hospital Stay: Admit: 2021-08-10 | Payer: Self-pay | Admitting: Internal Medicine

## 2021-08-10 ENCOUNTER — Inpatient Hospital Stay
Admission: AD | Admit: 2021-08-10 | Discharge: 2021-09-20 | Disposition: E | Payer: Medicare Other | Source: Other Acute Inpatient Hospital | Attending: Internal Medicine | Admitting: Internal Medicine

## 2021-08-10 ENCOUNTER — Other Ambulatory Visit (HOSPITAL_COMMUNITY): Payer: Self-pay

## 2021-08-10 DIAGNOSIS — Z431 Encounter for attention to gastrostomy: Secondary | ICD-10-CM

## 2021-08-10 DIAGNOSIS — G9341 Metabolic encephalopathy: Secondary | ICD-10-CM | POA: Diagnosis present

## 2021-08-10 DIAGNOSIS — J9690 Respiratory failure, unspecified, unspecified whether with hypoxia or hypercapnia: Secondary | ICD-10-CM

## 2021-08-10 DIAGNOSIS — J9621 Acute and chronic respiratory failure with hypoxia: Secondary | ICD-10-CM | POA: Diagnosis present

## 2021-08-10 DIAGNOSIS — R0602 Shortness of breath: Secondary | ICD-10-CM

## 2021-08-10 DIAGNOSIS — I639 Cerebral infarction, unspecified: Secondary | ICD-10-CM

## 2021-08-10 DIAGNOSIS — A419 Sepsis, unspecified organism: Secondary | ICD-10-CM | POA: Diagnosis present

## 2021-08-10 DIAGNOSIS — R0902 Hypoxemia: Secondary | ICD-10-CM

## 2021-08-10 DIAGNOSIS — Z930 Tracheostomy status: Secondary | ICD-10-CM

## 2021-08-10 DIAGNOSIS — G373 Acute transverse myelitis in demyelinating disease of central nervous system: Secondary | ICD-10-CM | POA: Diagnosis present

## 2021-08-10 DIAGNOSIS — R6521 Severe sepsis with septic shock: Secondary | ICD-10-CM | POA: Diagnosis not present

## 2021-08-10 DIAGNOSIS — E43 Unspecified severe protein-calorie malnutrition: Secondary | ICD-10-CM | POA: Diagnosis not present

## 2021-08-10 LAB — CULTURE, BLOOD (ROUTINE X 2)
Culture: NO GROWTH
Culture: NO GROWTH

## 2021-08-10 LAB — BASIC METABOLIC PANEL
Anion gap: 8 (ref 5–15)
BUN: 17 mg/dL (ref 6–20)
CO2: 25 mmol/L (ref 22–32)
Calcium: 8.5 mg/dL — ABNORMAL LOW (ref 8.9–10.3)
Chloride: 105 mmol/L (ref 98–111)
Creatinine, Ser: <0.3 mg/dL — ABNORMAL LOW (ref 0.61–1.24)
Glucose, Bld: 170 mg/dL — ABNORMAL HIGH (ref 70–99)
Potassium: 4.5 mmol/L (ref 3.5–5.1)
Sodium: 138 mmol/L (ref 135–145)

## 2021-08-10 LAB — GLUCOSE, CAPILLARY
Glucose-Capillary: 160 mg/dL — ABNORMAL HIGH (ref 70–99)
Glucose-Capillary: 171 mg/dL — ABNORMAL HIGH (ref 70–99)
Glucose-Capillary: 175 mg/dL — ABNORMAL HIGH (ref 70–99)
Glucose-Capillary: 183 mg/dL — ABNORMAL HIGH (ref 70–99)

## 2021-08-10 LAB — PROTIME-INR
INR: 1.1 (ref 0.8–1.2)
Prothrombin Time: 14.5 seconds (ref 11.4–15.2)

## 2021-08-10 MED ORDER — OXYCODONE HCL 5 MG PO TABS
5.0000 mg | ORAL_TABLET | Freq: Four times a day (QID) | ORAL | Status: DC | PRN
Start: 2021-08-10 — End: 2021-08-10
  Administered 2021-08-10 (×2): 5 mg
  Filled 2021-08-10 (×2): qty 1

## 2021-08-10 NOTE — Progress Notes (Signed)
Physical Therapy Wound Treatment Patient Details  Name: Dominic Benjamin MRN: [id number] Date of Birth: 08/01/1966  Today's Date: 08/06/2021 Time: 1140-1300 Time Calculation (min): 80 min  Subjective  Subjective Assessment Subjective: Shaking head yes and no to answer questions. Patient and Family Stated Goals: pt unable to participate due to lethargy Date of Onset:  (present on admission) Prior Treatments: unknown  Pain Score:  No pain after premedication  Wound Assessment  Pressure Injury 08/05/21 Coccyx Medial Unstageable - Full thickness tissue loss in which the base of the injury is covered by slough (yellow, tan, gray, green or brown) and/or eschar (tan, brown or black) in the wound bed. eschar (Active)  Dressing Type ABD;Barrier Film (skin prep);Gauze (Comment);Moist to moist;Normal saline moist dressing;Santyl 08/17/2021 1330  Dressing Intact;Changed 08/08/2021 1330  Dressing Change Frequency Daily 07/31/2021 1330  State of Healing Eschar 08/19/2021 1330  Site / Wound Assessment Brown;Black;Pale;Red 08/11/2021 1330  % Wound base Red or Granulating 15% 08/01/2021 1330  % Wound base Yellow/Fibrinous Exudate 75% 08/12/2021 1330  % Wound base Black/Eschar 10% 08/02/2021 1330  % Wound base Other/Granulation Tissue (Comment) 0% 07/29/2021 1330  Peri-wound Assessment Erythema (blanchable);Other (Comment) 07/23/2021 1330  Wound Length (cm) 9 cm 08/06/21 0936  Wound Width (cm) 7.9 cm 08/06/21 0936  Wound Depth (cm) 0.8 cm 08/06/21 0936  Wound Surface Area (cm^2) 71.1 cm^2 08/06/21 0936  Wound Volume (cm^3) 56.88 cm^3 08/06/21 0936  Tunneling (cm) 0 08/08/21 1142  Undermining (cm) 0 08/08/21 1142  Margins Unattached edges (unapproximated) 08/04/2021 1330  Drainage Amount Copious 08/19/2021 1330  Drainage Description Serous;Sanguineous 08/19/2021 1330  Treatment Cleansed;Debridement (Selective);Hydrotherapy (Pulse lavage);Packing (Saline gauze) 08/19/2021 1330      Hydrotherapy Pulsed lavage therapy -  wound location: sacrum Pulsed Lavage with Suction (psi): 8 psi Pulsed Lavage with Suction - Normal Saline Used: 1000 mL Pulsed Lavage Tip: Tip with splash shield Selective Debridement Selective Debridement - Location: sacrum Selective Debridement - Tools Used: Forceps, Scalpel Selective Debridement - Tissue Removed: unviable tissue    Wound Assessment and Plan  Wound Therapy - Assess/Plan/Recommendations Wound Therapy - Clinical Statement: Progressing with debridement. Silver nitrate utilized to control bleeding in 4 spots. This patient will benefit from continued hydrotherapy for selective removal of unviable tissue, to decrease bioburden, and promote wound bed healing. Wound Therapy - Functional Problem List: limited turning/positioning surfaces Factors Delaying/Impairing Wound Healing: Altered sensation, Incontinence, Infection - systemic/local, Immobility, Polypharmacy Hydrotherapy Plan: Debridement, Dressing change, Patient/family education, Pulsatile lavage with suction Wound Therapy - Frequency: 6X / week Wound Therapy - Follow Up Recommendations: f/u selective debridement, dressing changes by RN  Wound Therapy Goals- Improve the function of patient's integumentary system by progressing the wound(s) through the phases of wound healing (inflammation - proliferation - remodeling) by: Wound Therapy Goals - Improve the function of patient's integumentary system by progressing the wound(s) through the phases of wound healing by: Decrease Necrotic Tissue to: 60 Decrease Necrotic Tissue - Progress: Progressing toward goal Increase Granulation Tissue to: 40 Increase Granulation Tissue - Progress: Progressing toward goal Decrease Length/Width/Depth by (cm): 0.3/0.3/NA will likely increase depth Goals/treatment plan/discharge plan were made with and agreed upon by patient/family: Yes Time For Goal Achievement: 7 days Wound Therapy - Potential for Goals: Fair  Goals will be updated until  maximal potential achieved or discharge criteria met.  Discharge criteria: when goals achieved, discharge from hospital, MD decision/surgical intervention, no progress towards goals, refusal/missing three consecutive treatments without notification or medical reason.  GP  Charges PT Wound Care Charges $Wound Debridement up to 20 cm: < or equal to 20 cm $ Wound Debridement each add'l 20 sqcm: 3 $PT Hydrotherapy Dressing: 2 dressings $PT PLS Gun and Tip: 1 Supply $PT Hydrotherapy Visit: 1 Visit     08/13/2021  Ginger Carne., PT Acute Rehabilitation Services [phone]  (pager) [phone]  (office)  Tessie Fass Sent[place] Brent 08/04/2021, 1:35 PM

## 2021-08-10 NOTE — Progress Notes (Signed)
eLink Physician-Brief Progress Note Patient Name: Dominic Benjamin DOB: June 25, 1966 MRN: [id number]   Date of Service  08-17-21  HPI/Events of Note  2.5mg  oxycodone provides only partial relief of severe pain  eICU Interventions  Changed dose to 5mg      Intervention Category Intermediate Interventions: Pain - evaluation and management  08-17-2021, 3:06 AM

## 2021-08-11 ENCOUNTER — Other Ambulatory Visit (HOSPITAL_COMMUNITY): Payer: Self-pay

## 2021-08-11 DIAGNOSIS — G9341 Metabolic encephalopathy: Secondary | ICD-10-CM

## 2021-08-11 DIAGNOSIS — Z930 Tracheostomy status: Secondary | ICD-10-CM

## 2021-08-11 DIAGNOSIS — A419 Sepsis, unspecified organism: Secondary | ICD-10-CM

## 2021-08-11 DIAGNOSIS — G373 Acute transverse myelitis in demyelinating disease of central nervous system: Secondary | ICD-10-CM

## 2021-08-11 DIAGNOSIS — R6521 Severe sepsis with septic shock: Secondary | ICD-10-CM

## 2021-08-11 DIAGNOSIS — J9621 Acute and chronic respiratory failure with hypoxia: Secondary | ICD-10-CM | POA: Diagnosis not present

## 2021-08-11 DIAGNOSIS — I639 Cerebral infarction, unspecified: Secondary | ICD-10-CM | POA: Diagnosis not present

## 2021-08-11 NOTE — Progress Notes (Signed)
Pulmonary Critical Care Medicine [facility] [place]   PULMONARY CRITICAL CARE SERVICE  PROGRESS NOTE     Dominic Benjamin  WLN:[id number]  DOB: 09-Feb-1966   DOA: 08/05/2021  Referring Physician: Luna Kitchens, MD  HPI: Dominic Benjamin is a 55 y.o. male being followed for ventilator/airway/oxygen weaning Acute on Chronic Respiratory Failure.  Patient returns from critical care for further management.  He had been transferred with acute stroke.  Still remains on the trach appears to be resting comfortably right now this morning.  Medications: Reviewed on Rounds  Physical Exam:  Vitals: Temperature is 97.8 pulse 58 respiratory rate is 30 blood pressure is 90/42 saturations 100%  Ventilator Settings off ventilator on T collar currently on 35% FiO2  General: Comfortable at this time Neck: supple Cardiovascular: no malignant arrhythmias Respiratory: Scattered rhonchi expansion equal Skin: no rash seen on limited exam Musculoskeletal: No gross abnormality Psychiatric:unable to assess Neurologic:no involuntary movements         Lab Data:   Basic Metabolic Panel: Recent Labs  Lab 08/05/21 0537 08/05/21 0737 08/05/21 1443 08/06/21 0438 08/07/21 0321 08/07/21 1753 08/09/2021 0626  NA 136   < > 134* 135 135 140 138  K 5.2*   < > 3.6 2.9* 4.1 4.3 4.5  CL 105   < > 101 106 111 113* 105  CO2 20*   < > 20* 19* 19* 23 25  GLUCOSE 263*   < > 284* 378* 345* 204* 170*  BUN 64*   < > 58* 39* 32* 28* 17  CREATININE 0.61   < > 0.55* 0.43* 0.31* <0.30* <0.30*  CALCIUM 8.1*   < > 7.7* 8.4* 8.1* 8.2* 8.5*  MG 2.0  --   --  1.8 2.0 1.7  --   PHOS  --   --   --   --  1.3* 1.9*  --    < > = values in this interval not displayed.    ABG: Recent Labs  Lab 08/05/21 0525 08/05/21 0751  PHART 7.443  --   PCO2ART 29.5*  --   PO2ART 134*  --   HCO3 19.9* 21.3  O2SAT 98.9 93.0    Liver Function Tests: Recent Labs  Lab 08/05/21 0537 08/05/21 0737 08/05/21 1443  08/06/21 0438  AST 76* 86* 59* 31  ALT 79* 91* 82* 65*  ALKPHOS 112 123 105 87  BILITOT 1.2 1.4* 1.4* 1.0  PROT 5.7* 6.0* 4.9* 4.4*  ALBUMIN 3.2* 3.4* 2.8* 2.4*   No results for input(s): LIPASE, AMYLASE in the last 168 hours. Recent Labs  Lab 08/05/21 0537  AMMONIA 44*    CBC: Recent Labs  Lab 08/05/21 0737 08/05/21 0751 08/06/21 0736 08/07/21 0321 08/08/21 1205 08/09/21 0359  WBC 15.0*  --  20.9* 15.2* 8.3 6.5  NEUTROABS 11.8*  --   --   --  6.6 4.8  HGB 15.9 17.0  17.3* 13.0 10.4* 9.8* 10.1*  HCT 53.1* 50.0  51.0 39.4 33.4* 31.7* 32.6*  MCV 93.8  --  87.0 90.0 92.2 90.6  PLT 83*  --  39* 29* 28* 32*    Cardiac Enzymes: Recent Labs  Lab 08/05/21 0537 08/05/21 1443  CKTOTAL 46* 85  CKMB 2.8  --     BNP (last 3 results) No results for input(s): BNP in the last 8760 hours.  ProBNP (last 3 results) No results for input(s): PROBNP in the last 8760 hours.  Radiological Exams: DG Chest [facility] 1 View  Result  Date: 08/11/2021 CLINICAL DATA:  55 year old male with quadriparesis, respiratory failure. EXAM: PORTABLE CHEST 1 VIEW COMPARISON:  Portable chest 08/05/2021 and earlier. FINDINGS: Portable AP semi upright view at 0546 hours. Stable tracheostomy tube. Mildly lower lung volumes, relatively normal. Normal cardiac size and mediastinal contours. Left subclavian central line removed. Allowing for portable technique the lungs are clear. No pneumothorax or pleural effusion. No acute osseous abnormality identified. IMPRESSION: Stable tracheostomy.  No acute cardiopulmonary abnormality. Electronically Signed   By: Odessa Fleming M.D.   On: 08/11/2021 06:45   DG Abd Portable 1V  Result Date: 08/18/2021 CLINICAL DATA:  Check gastrostomy catheter placement EXAM: PORTABLE ABDOMEN - 1 VIEW COMPARISON:  08/07/2021 FINDINGS: Scattered large and small bowel gas is noted. Calcification is noted in the lower pole of the right kidney stable in appearance from the prior exam. Gastrostomy  catheter is noted projecting over the expected region of the stomach although no contrast was administered. No obstructive changes are seen. IMPRESSION: Gastrostomy catheter projects over the stomach although no contrast was administered. Stable right renal stone. Electronically Signed   By: Alcide Clever M.D.   On: 08/18/2021 18:21    Assessment/Plan Active Problems:   Acute on chronic respiratory failure with hypoxia (HCC)   Transverse myelitis (HCC)   Septic shock (HCC)   Acute stroke of basal ganglia (HCC)   Acute metabolic encephalopathy   Tracheostomy dependence (HCC)   Acute on chronic respiratory failure hypoxia patient remains off the ventilator and will be continued on full support on the T collar. Acute left basal ganglia stroke no change we will continue with supportive care patient has already been evaluated by neurology Septic shock resolved hemodynamics are stable we will continue to monitor along closely. Acute metabolic encephalopathy likely multifactorial including the stroke and sepsis Tracheostomy dependent we will continue with supportive care management   I have personally seen and evaluated the patient, evaluated laboratory and imaging results, formulated the assessment and plan and placed orders. The Patient requires high complexity decision making with multiple systems involvement.  Rounds were done with the Respiratory Therapy Director and Staff therapists and discussed with nursing staff also.  Yevonne Pax, MD [facility] Pulmonary Critical Care Medicine Sleep Medicine

## 2021-08-12 DIAGNOSIS — A419 Sepsis, unspecified organism: Secondary | ICD-10-CM | POA: Diagnosis not present

## 2021-08-12 DIAGNOSIS — I639 Cerebral infarction, unspecified: Secondary | ICD-10-CM | POA: Diagnosis not present

## 2021-08-12 DIAGNOSIS — G9341 Metabolic encephalopathy: Secondary | ICD-10-CM | POA: Diagnosis not present

## 2021-08-12 DIAGNOSIS — J9621 Acute and chronic respiratory failure with hypoxia: Secondary | ICD-10-CM | POA: Diagnosis not present

## 2021-08-12 LAB — URINALYSIS, ROUTINE W REFLEX MICROSCOPIC
Bacteria, UA: NONE SEEN
Bilirubin Urine: NEGATIVE
Glucose, UA: >=500 mg/dL — AB
Hgb urine dipstick: NEGATIVE
Ketones, ur: 5 mg/dL — AB
Nitrite: NEGATIVE
Protein, ur: 30 mg/dL — AB
Specific Gravity, Urine: 1.028 (ref 1.005–1.030)
WBC, UA: >50 WBC/hpf — ABNORMAL HIGH (ref 0–5)
pH: 6 (ref 5.0–8.0)

## 2021-08-12 LAB — BASIC METABOLIC PANEL
Anion gap: 9 (ref 5–15)
BUN: 27 mg/dL — ABNORMAL HIGH (ref 6–20)
CO2: 24 mmol/L (ref 22–32)
Calcium: 8.6 mg/dL — ABNORMAL LOW (ref 8.9–10.3)
Chloride: 107 mmol/L (ref 98–111)
Creatinine, Ser: 0.35 mg/dL — ABNORMAL LOW (ref 0.61–1.24)
GFR, Estimated: >60 mL/min (ref >60)
Glucose, Bld: 285 mg/dL — ABNORMAL HIGH (ref 70–99)
Potassium: 4.5 mmol/L (ref 3.5–5.1)
Sodium: 140 mmol/L (ref 135–145)

## 2021-08-12 LAB — CBC
HCT: 31.2 % — ABNORMAL LOW (ref 39.0–52.0)
Hemoglobin: 9.5 g/dL — ABNORMAL LOW (ref 13.0–17.0)
MCH: 28 pg (ref 26.0–34.0)
MCHC: 30.4 g/dL (ref 30.0–36.0)
MCV: 92 fL (ref 80.0–100.0)
Platelets: 99 10*3/uL — ABNORMAL LOW (ref 150–400)
RBC: 3.39 MIL/uL — ABNORMAL LOW (ref 4.22–5.81)
RDW: 19 % — ABNORMAL HIGH (ref 11.5–15.5)
WBC: 10.6 10*3/uL — ABNORMAL HIGH (ref 4.0–10.5)
nRBC: 0 % (ref 0.0–0.2)

## 2021-08-12 NOTE — Progress Notes (Signed)
Pulmonary Critical Care Medicine [facility] [place]   PULMONARY CRITICAL CARE SERVICE  PROGRESS NOTE     Dominic Benjamin  [id number]:[id number]  DOB: [birth year]   DOA: 2021/08/14  Referring Physician: Luna Kitchens, MD  HPI: Dominic Benjamin is a 55 y.o. male being followed for ventilator/airway/oxygen weaning Acute on Chronic Respiratory Failure.  Patient currently is on T collar on 35% FiO2 with good saturations secretions have been fairly moderate.  Medications: Reviewed on Rounds  Physical Exam:  Vitals: Temperature is 97.2 pulse 59 respiratory 26 blood pressure is 126/71 saturations 100%  Ventilator Settings on T collar with an FiO2 of 35%  General: Comfortable at this time Neck: supple Cardiovascular: no malignant arrhythmias Respiratory: Scattered rhonchi expansion is equal Skin: no rash seen on limited exam Musculoskeletal: No gross abnormality Psychiatric:unable to assess Neurologic:no involuntary movements         Lab Data:   Basic Metabolic Panel: Recent Labs  Lab 08/06/21 0438 08/07/21 0321 08/07/21 1753 08/14/21 0626 08/12/21 0331  NA 135 135 140 138 140  K 2.9* 4.1 4.3 4.5 4.5  CL 106 111 113* 105 107  CO2 19* 19* 23 25 24   GLUCOSE 378* 345* 204* 170* 285*  BUN 39* 32* 28* 17 27*  CREATININE 0.43* 0.31* <0.30* <0.30* 0.35*  CALCIUM 8.4* 8.1* 8.2* 8.5* 8.6*  MG 1.8 2.0 1.7  --   --   PHOS  --  1.3* 1.9*  --   --     ABG: No results for input(s): PHART, PCO2ART, PO2ART, HCO3, O2SAT in the last 168 hours.  Liver Function Tests: Recent Labs  Lab 08/05/21 1443 08/06/21 0438  AST 59* 31  ALT 82* 65*  ALKPHOS 105 87  BILITOT 1.4* 1.0  PROT 4.9* 4.4*  ALBUMIN 2.8* 2.4*   No results for input(s): LIPASE, AMYLASE in the last 168 hours. No results for input(s): AMMONIA in the last 168 hours.  CBC: Recent Labs  Lab 08/06/21 0736 08/07/21 0321 08/08/21 1205 08/09/21 0359 08/12/21 0331  WBC 20.9* 15.2* 8.3 6.5 10.6*   NEUTROABS  --   --  6.6 4.8  --   HGB 13.0 10.4* 9.8* 10.1* 9.5*  HCT 39.4 33.4* 31.7* 32.6* 31.2*  MCV 87.0 90.0 92.2 90.6 92.0  PLT 39* 29* 28* 32* 99*    Cardiac Enzymes: Recent Labs  Lab 08/05/21 1443  CKTOTAL 85    BNP (last 3 results) No results for input(s): BNP in the last 8760 hours.  ProBNP (last 3 results) No results for input(s): PROBNP in the last 8760 hours.  Radiological Exams: DG Chest Port 1 View  Result Date: 08/11/2021 CLINICAL DATA:  55 year old male with quadriparesis, respiratory failure. EXAM: PORTABLE CHEST 1 VIEW COMPARISON:  Portable chest 08/05/2021 and earlier. FINDINGS: Portable AP semi upright view at 0546 hours. Stable tracheostomy tube. Mildly lower lung volumes, relatively normal. Normal cardiac size and mediastinal contours. Left subclavian central line removed. Allowing for portable technique the lungs are clear. No pneumothorax or pleural effusion. No acute osseous abnormality identified. IMPRESSION: Stable tracheostomy.  No acute cardiopulmonary abnormality. Electronically Signed   By: 08/07/2021 M.D.   On: 08/11/2021 06:45   DG Abd Portable 1V  Result Date: 14-Aug-2021 CLINICAL DATA:  Check gastrostomy catheter placement EXAM: PORTABLE ABDOMEN - 1 VIEW COMPARISON:  08/07/2021 FINDINGS: Scattered large and small bowel gas is noted. Calcification is noted in the lower pole of the right kidney stable in appearance from the prior exam. Gastrostomy catheter  is noted projecting over the expected region of the stomach although no contrast was administered. No obstructive changes are seen. IMPRESSION: Gastrostomy catheter projects over the stomach although no contrast was administered. Stable right renal stone. Electronically Signed   By: Alcide Clever M.D.   On: Aug 16, 2021 18:21    Assessment/Plan Active Problems:   Acute on chronic respiratory failure with hypoxia (HCC)   Transverse myelitis (HCC)   Septic shock (HCC)   Acute stroke of basal ganglia  (HCC)   Acute metabolic encephalopathy   Tracheostomy dependence (HCC)   Acute on chronic respiratory failure with hypoxia plan is going to be to continue with T collar secretions are limiting factor as far as being able to wean. Transverse myelitis no change we will continue to follow along closely. Sepsis with shock supportive care Acute stroke therapy as tolerated Metabolic encephalopathy no change Tracheostomy will remain in place for now   I have personally seen and evaluated the patient, evaluated laboratory and imaging results, formulated the assessment and plan and placed orders. The Patient requires high complexity decision making with multiple systems involvement.  Rounds were done with the Respiratory Therapy Director and Staff therapists and discussed with nursing staff also.  Yevonne Pax, MD [facility] Pulmonary Critical Care Medicine Sleep Medicine

## 2021-08-14 DIAGNOSIS — A419 Sepsis, unspecified organism: Secondary | ICD-10-CM | POA: Diagnosis not present

## 2021-08-14 DIAGNOSIS — J9621 Acute and chronic respiratory failure with hypoxia: Secondary | ICD-10-CM | POA: Diagnosis not present

## 2021-08-14 DIAGNOSIS — G9341 Metabolic encephalopathy: Secondary | ICD-10-CM | POA: Diagnosis not present

## 2021-08-14 DIAGNOSIS — I639 Cerebral infarction, unspecified: Secondary | ICD-10-CM | POA: Diagnosis not present

## 2021-08-14 LAB — URINE CULTURE: Culture: 20000 — AB

## 2021-08-14 LAB — BLOOD CULTURE ID PANEL (REFLEXED) - BCID2

## 2021-08-14 NOTE — Progress Notes (Signed)
Pulmonary Critical Care Medicine [facility] [place]   PULMONARY CRITICAL CARE SERVICE  PROGRESS NOTE     Dominic Benjamin  [id number]:[id number]  DOB: 13-Oct-1966   DOA: August 27, 2021  Referring Physician: Luna Kitchens, MD  HPI: Dominic Benjamin is a 55 y.o. male being followed for ventilator/airway/oxygen weaning Acute on Chronic Respiratory Failure.  Currently is on T collar has been on 35% FiO2  Medications: Reviewed on Rounds  Physical Exam:  Vitals: Temperature is 99.8 pulse 81 respiratory rate is 31 blood pressure is 123/51 saturations 98%  Ventilator Settings on T collar FiO2 of 35%  General: Comfortable at this time Neck: supple Cardiovascular: no malignant arrhythmias Respiratory: Scattered rhonchi expansion is equal Skin: no rash seen on limited exam Musculoskeletal: No gross abnormality Psychiatric:unable to assess Neurologic:no involuntary movements         Lab Data:   Basic Metabolic Panel: Recent Labs  Lab 08/07/21 1753 2021/08/27 0626 08/12/21 0331  NA 140 138 140  K 4.3 4.5 4.5  CL 113* 105 107  CO2 23 25 24   GLUCOSE 204* 170* 285*  BUN 28* 17 27*  CREATININE <0.30* <0.30* 0.35*  CALCIUM 8.2* 8.5* 8.6*  MG 1.7  --   --   PHOS 1.9*  --   --     ABG: No results for input(s): PHART, PCO2ART, PO2ART, HCO3, O2SAT in the last 168 hours.  Liver Function Tests: No results for input(s): AST, ALT, ALKPHOS, BILITOT, PROT, ALBUMIN in the last 168 hours. No results for input(s): LIPASE, AMYLASE in the last 168 hours. No results for input(s): AMMONIA in the last 168 hours.  CBC: Recent Labs  Lab 08/08/21 1205 08/09/21 0359 08/12/21 0331  WBC 8.3 6.5 10.6*  NEUTROABS 6.6 4.8  --   HGB 9.8* 10.1* 9.5*  HCT 31.7* 32.6* 31.2*  MCV 92.2 90.6 92.0  PLT 28* 32* 99*    Cardiac Enzymes: No results for input(s): CKTOTAL, CKMB, CKMBINDEX, TROPONINI in the last 168 hours.  BNP (last 3 results) No results for input(s): BNP in the last 8760  hours.  ProBNP (last 3 results) No results for input(s): PROBNP in the last 8760 hours.  Radiological Exams: No results found.  Assessment/Plan Active Problems:   Acute on chronic respiratory failure with hypoxia (HCC)   Transverse myelitis (HCC)   Septic shock (HCC)   Acute stroke of basal ganglia (HCC)   Acute metabolic encephalopathy   Tracheostomy dependence (HCC)   Acute on chronic respiratory failure hypoxia we will continue with T collar trials titrate oxygen continue secretion management pulmonary toilet. Septic shock resolved hemodynamics are stable Transverse myelitis no change Acute stroke supportive care patient is nonverbal Tracheostomy remains in place   I have personally seen and evaluated the patient, evaluated laboratory and imaging results, formulated the assessment and plan and placed orders. The Patient requires high complexity decision making with multiple systems involvement.  Rounds were done with the Respiratory Therapy Director and Staff therapists and discussed with nursing staff also.  08/14/21, MD [facility] Pulmonary Critical Care Medicine Sleep Medicine

## 2021-08-15 DIAGNOSIS — J9621 Acute and chronic respiratory failure with hypoxia: Secondary | ICD-10-CM | POA: Diagnosis not present

## 2021-08-15 DIAGNOSIS — I639 Cerebral infarction, unspecified: Secondary | ICD-10-CM | POA: Diagnosis not present

## 2021-08-15 DIAGNOSIS — A419 Sepsis, unspecified organism: Secondary | ICD-10-CM | POA: Diagnosis not present

## 2021-08-15 DIAGNOSIS — G9341 Metabolic encephalopathy: Secondary | ICD-10-CM | POA: Diagnosis not present

## 2021-08-15 LAB — CBC
HCT: 33.8 % — ABNORMAL LOW (ref 39.0–52.0)
Hemoglobin: 10.3 g/dL — ABNORMAL LOW (ref 13.0–17.0)
MCH: 28.3 pg (ref 26.0–34.0)
MCHC: 30.5 g/dL (ref 30.0–36.0)
MCV: 92.9 fL (ref 80.0–100.0)
Platelets: 126 10*3/uL — ABNORMAL LOW (ref 150–400)
RBC: 3.64 MIL/uL — ABNORMAL LOW (ref 4.22–5.81)
RDW: 19.5 % — ABNORMAL HIGH (ref 11.5–15.5)
WBC: 10.7 10*3/uL — ABNORMAL HIGH (ref 4.0–10.5)
nRBC: 0.6 % — ABNORMAL HIGH (ref 0.0–0.2)

## 2021-08-15 LAB — BASIC METABOLIC PANEL
Anion gap: 8 (ref 5–15)
BUN: 28 mg/dL — ABNORMAL HIGH (ref 6–20)
CO2: 25 mmol/L (ref 22–32)
Calcium: 8.9 mg/dL (ref 8.9–10.3)
Chloride: 105 mmol/L (ref 98–111)
Creatinine, Ser: <0.3 mg/dL — ABNORMAL LOW (ref 0.61–1.24)
Glucose, Bld: 169 mg/dL — ABNORMAL HIGH (ref 70–99)
Potassium: 5 mmol/L (ref 3.5–5.1)
Sodium: 138 mmol/L (ref 135–145)

## 2021-08-15 LAB — MAGNESIUM: Magnesium: 1.9 mg/dL (ref 1.7–2.4)

## 2021-08-15 NOTE — Progress Notes (Signed)
Pulmonary Critical Care Medicine [facility] [place]   PULMONARY CRITICAL CARE SERVICE  PROGRESS NOTE     Dominic Benjamin  [id number]  DOB: 09/03/1966   DOA: 07/31/2021  Referring Physician: Luna Kitchens, MD  HPI: Dominic Benjamin is a 55 y.o. male being followed for ventilator/airway/oxygen weaning Acute on Chronic Respiratory Failure.  Patient is comfortable right now without distress has been afebrile  Medications: Reviewed on Rounds  Physical Exam:  Vitals: Temperature is 97.8 pulse 70 respiratory rate is 29 blood pressure 114/69 saturations 100%  Ventilator Settings of the ventilator on T collar [place]  General: Comfortable at this time Neck: supple Cardiovascular: no malignant arrhythmias Respiratory: No rhonchi very coarse breath sounds Skin: no rash seen on limited exam Musculoskeletal: No gross abnormality Psychiatric:unable to assess Neurologic:no involuntary movements         Lab Data:   Basic Metabolic Panel: Recent Labs  Lab 07/24/2021 0626 08/12/21 0331  NA 138 140  K 4.5 4.5  CL 105 107  CO2 25 24  GLUCOSE 170* 285*  BUN 17 27*  CREATININE <0.30* 0.35*  CALCIUM 8.5* 8.6*    ABG: No results for input(s): PHART, PCO2ART, PO2ART, HCO3, O2SAT in the last 168 hours.  Liver Function Tests: No results for input(s): AST, ALT, ALKPHOS, BILITOT, PROT, ALBUMIN in the last 168 hours. No results for input(s): LIPASE, AMYLASE in the last 168 hours. No results for input(s): AMMONIA in the last 168 hours.  CBC: Recent Labs  Lab 08/08/21 1205 08/09/21 0359 08/12/21 0331  WBC 8.3 6.5 10.6*  NEUTROABS 6.6 4.8  --   HGB 9.8* 10.1* 9.5*  HCT 31.7* 32.6* 31.2*  MCV 92.2 90.6 92.0  PLT 28* 32* 99*    Cardiac Enzymes: No results for input(s): CKTOTAL, CKMB, CKMBINDEX, TROPONINI in the last 168 hours.  BNP (last 3 results) No results for input(s): BNP in the last 8760 hours.  ProBNP (last 3 results) No results for  input(s): PROBNP in the last 8760 hours.  Radiological Exams: No results found.  Assessment/Plan Active Problems:   Acute on chronic respiratory failure with hypoxia (HCC)   Transverse myelitis (HCC)   Septic shock (HCC)   Acute stroke of basal ganglia (HCC)   Acute metabolic encephalopathy   Tracheostomy dependence (HCC)   Acute on chronic respiratory failure hypoxia we will continue with the T-piece continue aggressive pulmonary toilet and secretion management. Transverse myelitis no change supportive care Acute stroke he has really had no meaningful neurological recovery so far Metabolic encephalopathy supportive care Tracheostomy remains in place   I have personally seen and evaluated the patient, evaluated laboratory and imaging results, formulated the assessment and plan and placed orders. The Patient requires high complexity decision making with multiple systems involvement.  Rounds were done with the Respiratory Therapy Director and Staff therapists and discussed with nursing staff also.  Yevonne Pax, MD [facility] Pulmonary Critical Care Medicine Sleep Medicine

## 2021-08-16 ENCOUNTER — Other Ambulatory Visit (HOSPITAL_COMMUNITY): Payer: Self-pay

## 2021-08-16 DIAGNOSIS — J9621 Acute and chronic respiratory failure with hypoxia: Secondary | ICD-10-CM | POA: Diagnosis not present

## 2021-08-16 DIAGNOSIS — A419 Sepsis, unspecified organism: Secondary | ICD-10-CM | POA: Diagnosis not present

## 2021-08-16 DIAGNOSIS — I639 Cerebral infarction, unspecified: Secondary | ICD-10-CM | POA: Diagnosis not present

## 2021-08-16 DIAGNOSIS — G9341 Metabolic encephalopathy: Secondary | ICD-10-CM | POA: Diagnosis not present

## 2021-08-16 LAB — POTASSIUM: Potassium: 4.5 mmol/L (ref 3.5–5.1)

## 2021-08-16 LAB — CULTURE, BLOOD (ROUTINE X 2)

## 2021-08-16 NOTE — Progress Notes (Signed)
Pulmonary Critical Care Medicine [facility] [place]   PULMONARY CRITICAL CARE SERVICE  PROGRESS NOTE     Dominic Benjamin  [id number]:[id number]  DOB: 1966/04/02   DOA: 07/23/2021  Referring Physician: Luna Kitchens, MD  HPI: Dominic Benjamin is a 55 y.o. male being followed for ventilator/airway/oxygen weaning Acute on Chronic Respiratory Failure.  Patient currently is on T collar has been on 40% FiO2  Medications: Reviewed on Rounds  Physical Exam:  Vitals: Temperature is 99.0 pulse 80 respiratory is 20 blood pressure is 101/54  Ventilator Settings T collar FiO2 40%  General: Comfortable at this time Neck: supple Cardiovascular: no malignant arrhythmias Respiratory: Scattered rhonchi expansion is equal Skin: no rash seen on limited exam Musculoskeletal: No gross abnormality Psychiatric:unable to assess Neurologic:no involuntary movements         Lab Data:   Basic Metabolic Panel: Recent Labs  Lab 07/28/2021 0626 08/12/21 0331 08/15/21 0808 08/16/21 0649  NA 138 140 138  --   K 4.5 4.5 5.0 4.5  CL 105 107 105  --   CO2 25 24 25   --   GLUCOSE 170* 285* 169*  --   BUN 17 27* 28*  --   CREATININE <0.30* 0.35* <0.30*  --   CALCIUM 8.5* 8.6* 8.9  --   MG  --   --  1.9  --     ABG: No results for input(s): PHART, PCO2ART, PO2ART, HCO3, O2SAT in the last 168 hours.  Liver Function Tests: No results for input(s): AST, ALT, ALKPHOS, BILITOT, PROT, ALBUMIN in the last 168 hours. No results for input(s): LIPASE, AMYLASE in the last 168 hours. No results for input(s): AMMONIA in the last 168 hours.  CBC: Recent Labs  Lab 08/12/21 0331 08/15/21 0808  WBC 10.6* 10.7*  HGB 9.5* 10.3*  HCT 31.2* 33.8*  MCV 92.0 92.9  PLT 99* 126*    Cardiac Enzymes: No results for input(s): CKTOTAL, CKMB, CKMBINDEX, TROPONINI in the last 168 hours.  BNP (last 3 results) No results for input(s): BNP in the last 8760 hours.  ProBNP (last 3 results) No results  for input(s): PROBNP in the last 8760 hours.  Radiological Exams: No results found.  Assessment/Plan Active Problems:   Acute on chronic respiratory failure with hypoxia (HCC)   Transverse myelitis (HCC)   Septic shock (HCC)   Acute stroke of basal ganglia (HCC)   Acute metabolic encephalopathy   Tracheostomy dependence (HCC)   Acute on chronic respiratory failure hypoxia we will continue with the weaning on the T-piece secretions are moderate continue pulmonary toilet and supportive care Transverse myelitis patient is at baseline right now Sepsis with shock continue with supportive care resolved hemodynamics are stable Acute stroke no change Metabolic encephalopathy patient is at baseline Tracheostomy will remain in place right now   I have personally seen and evaluated the patient, evaluated laboratory and imaging results, formulated the assessment and plan and placed orders. The Patient requires high complexity decision making with multiple systems involvement.  Rounds were done with the Respiratory Therapy Director and Staff therapists and discussed with nursing staff also.  08/17/21, MD [facility] Pulmonary Critical Care Medicine Sleep Medicine

## 2021-08-17 DIAGNOSIS — G9341 Metabolic encephalopathy: Secondary | ICD-10-CM | POA: Diagnosis not present

## 2021-08-17 DIAGNOSIS — A419 Sepsis, unspecified organism: Secondary | ICD-10-CM | POA: Diagnosis not present

## 2021-08-17 DIAGNOSIS — J9621 Acute and chronic respiratory failure with hypoxia: Secondary | ICD-10-CM | POA: Diagnosis not present

## 2021-08-17 DIAGNOSIS — I639 Cerebral infarction, unspecified: Secondary | ICD-10-CM | POA: Diagnosis not present

## 2021-08-17 LAB — CULTURE, BLOOD (ROUTINE X 2): Culture: NO GROWTH

## 2021-08-17 NOTE — Progress Notes (Signed)
Pulmonary Critical Care Medicine [facility] [place]   PULMONARY CRITICAL CARE SERVICE  PROGRESS NOTE     Camdyn Beske  DIX:[id number]  DOB: Dec 18, 1965   DOA: 07/30/2021  Referring Physician: Luna Kitchens, MD  HPI: Darren Caldron is a 55 y.o. male being followed for ventilator/airway/oxygen weaning Acute on Chronic Respiratory Failure.  At this time patient is on T collar has been on 40% FiO2  Medications: Reviewed on Rounds  Physical Exam:  Vitals: Temperature 99.9 pulse 76 respiratory rate is 20 blood pressure is 123/71 saturations 100%  Ventilator Settings patient on T collar FiO2 40%  General: Comfortable at this time Neck: supple Cardiovascular: no malignant arrhythmias Respiratory: Scattered rhonchi expansion is equal Skin: no rash seen on limited exam Musculoskeletal: No gross abnormality Psychiatric:unable to assess Neurologic:no involuntary movements         Lab Data:   Basic Metabolic Panel: Recent Labs  Lab 08/12/21 0331 08/15/21 0808 08/16/21 0649  NA 140 138  --   K 4.5 5.0 4.5  CL 107 105  --   CO2 24 25  --   GLUCOSE 285* 169*  --   BUN 27* 28*  --   CREATININE 0.35* <0.30*  --   CALCIUM 8.6* 8.9  --   MG  --  1.9  --     ABG: No results for input(s): PHART, PCO2ART, PO2ART, HCO3, O2SAT in the last 168 hours.  Liver Function Tests: No results for input(s): AST, ALT, ALKPHOS, BILITOT, PROT, ALBUMIN in the last 168 hours. No results for input(s): LIPASE, AMYLASE in the last 168 hours. No results for input(s): AMMONIA in the last 168 hours.  CBC: Recent Labs  Lab 08/12/21 0331 08/15/21 0808  WBC 10.6* 10.7*  HGB 9.5* 10.3*  HCT 31.2* 33.8*  MCV 92.0 92.9  PLT 99* 126*    Cardiac Enzymes: No results for input(s): CKTOTAL, CKMB, CKMBINDEX, TROPONINI in the last 168 hours.  BNP (last 3 results) No results for input(s): BNP in the last 8760 hours.  ProBNP (last 3 results) No results for input(s): PROBNP  in the last 8760 hours.  Radiological Exams: DG CHEST PORT 1 VIEW  Result Date: 08/16/2021 CLINICAL DATA:  Hypoxemia. EXAM: PORTABLE CHEST 1 VIEW COMPARISON:  Chest x-ray 08/11/2021 FINDINGS: Tracheostomy surgical changes. The heart and mediastinal contours are unchanged. Interval development of a hazy airspace opacity within the left lower lobe. No pulmonary edema. Interval development of a trace left pleural effusion. No right pleural effusion. No pneumothorax. No acute osseous abnormality. IMPRESSION: Hazy left lower lobe airspace opacity as well as trace left pleural effusion suggestive of infection/inflammation. Electronically Signed   By: Tish Frederickson M.D.   On: 08/16/2021 16:53    Assessment/Plan Active Problems:   Acute on chronic respiratory failure with hypoxia (HCC)   Transverse myelitis (HCC)   Septic shock (HCC)   Acute stroke of basal ganglia (HCC)   Acute metabolic encephalopathy   Tracheostomy dependence (HCC)   Acute on chronic respiratory failure with hypoxia we will continue with the T collar Reitnauer titrate oxygen down as tolerated Transverse myelitis no change supportive care Acute stroke but does not seem to be any improvement Metabolic encephalopathy supportive care Tracheostomy remains in place   I have personally seen and evaluated the patient, evaluated laboratory and imaging results, formulated the assessment and plan and placed orders. The Patient requires high complexity decision making with multiple systems involvement.  Rounds were done with the Respiratory Therapy Director  and Staff therapists and discussed with nursing staff also.  Allyne Gee, MD [facility] Pulmonary Critical Care Medicine Sleep Medicine

## 2021-08-18 DIAGNOSIS — I639 Cerebral infarction, unspecified: Secondary | ICD-10-CM | POA: Diagnosis not present

## 2021-08-18 DIAGNOSIS — G9341 Metabolic encephalopathy: Secondary | ICD-10-CM | POA: Diagnosis not present

## 2021-08-18 DIAGNOSIS — J9621 Acute and chronic respiratory failure with hypoxia: Secondary | ICD-10-CM | POA: Diagnosis not present

## 2021-08-18 DIAGNOSIS — A419 Sepsis, unspecified organism: Secondary | ICD-10-CM | POA: Diagnosis not present

## 2021-08-18 LAB — BASIC METABOLIC PANEL
Anion gap: 7 (ref 5–15)
BUN: 22 mg/dL — ABNORMAL HIGH (ref 6–20)
CO2: 25 mmol/L (ref 22–32)
Calcium: 8.3 mg/dL — ABNORMAL LOW (ref 8.9–10.3)
Chloride: 105 mmol/L (ref 98–111)
Creatinine, Ser: <0.3 mg/dL — ABNORMAL LOW (ref 0.61–1.24)
Glucose, Bld: 155 mg/dL — ABNORMAL HIGH (ref 70–99)
Potassium: 4.2 mmol/L (ref 3.5–5.1)
Sodium: 137 mmol/L (ref 135–145)

## 2021-08-18 LAB — CBC
HCT: 32.7 % — ABNORMAL LOW (ref 39.0–52.0)
Hemoglobin: 10.1 g/dL — ABNORMAL LOW (ref 13.0–17.0)
MCH: 29 pg (ref 26.0–34.0)
MCHC: 30.9 g/dL (ref 30.0–36.0)
MCV: 94 fL (ref 80.0–100.0)
Platelets: 96 10*3/uL — ABNORMAL LOW (ref 150–400)
RBC: 3.48 MIL/uL — ABNORMAL LOW (ref 4.22–5.81)
RDW: 19.9 % — ABNORMAL HIGH (ref 11.5–15.5)
WBC: 10.9 10*3/uL — ABNORMAL HIGH (ref 4.0–10.5)
nRBC: 0.3 % — ABNORMAL HIGH (ref 0.0–0.2)

## 2021-08-18 LAB — MAGNESIUM: Magnesium: 1.7 mg/dL (ref 1.7–2.4)

## 2021-08-18 LAB — VANCOMYCIN, TROUGH: Vancomycin Tr: 16 ug/mL (ref 15–20)

## 2021-08-18 NOTE — Progress Notes (Signed)
Pulmonary Critical Care Medicine [facility] [place]   PULMONARY CRITICAL CARE SERVICE  PROGRESS NOTE     Dominic Benjamin  UXN:[id number]  DOB: July 30, 1966   DOA: 09/03/21  Referring Physician: Luna Kitchens, MD  HPI: Dominic Benjamin is a 55 y.o. male being followed for ventilator/airway/oxygen weaning Acute on Chronic Respiratory Failure.  Currently is on T collar has been on 25% FiO2 with good saturation  Medications: Reviewed on Rounds  Physical Exam:  Vitals: Temperature is 97.4 pulse 57 respiratory 26 blood pressure is 102/59 saturations 98%  Ventilator Settings off ventilator on T collar 35%  General: Comfortable at this time Neck: supple Cardiovascular: no malignant arrhythmias Respiratory: Scattered rhonchi expansion is equal Skin: no rash seen on limited exam Musculoskeletal: No gross abnormality Psychiatric:unable to assess Neurologic:no involuntary movements         Lab Data:   Basic Metabolic Panel: Recent Labs  Lab 08/12/21 0331 08/15/21 0808 08/16/21 0649 08/18/21 0605  NA 140 138  --  137  K 4.5 5.0 4.5 4.2  CL 107 105  --  105  CO2 24 25  --  25  GLUCOSE 285* 169*  --  155*  BUN 27* 28*  --  22*  CREATININE 0.35* <0.30*  --  <0.30*  CALCIUM 8.6* 8.9  --  8.3*  MG  --  1.9  --  1.7    ABG: No results for input(s): PHART, PCO2ART, PO2ART, HCO3, O2SAT in the last 168 hours.  Liver Function Tests: No results for input(s): AST, ALT, ALKPHOS, BILITOT, PROT, ALBUMIN in the last 168 hours. No results for input(s): LIPASE, AMYLASE in the last 168 hours. No results for input(s): AMMONIA in the last 168 hours.  CBC: Recent Labs  Lab 08/12/21 0331 08/15/21 0808 08/18/21 0605  WBC 10.6* 10.7* 10.9*  HGB 9.5* 10.3* 10.1*  HCT 31.2* 33.8* 32.7*  MCV 92.0 92.9 94.0  PLT 99* 126* 96*    Cardiac Enzymes: No results for input(s): CKTOTAL, CKMB, CKMBINDEX, TROPONINI in the last 168 hours.  BNP (last 3 results) No results  for input(s): BNP in the last 8760 hours.  ProBNP (last 3 results) No results for input(s): PROBNP in the last 8760 hours.  Radiological Exams: DG CHEST PORT 1 VIEW  Result Date: 08/16/2021 CLINICAL DATA:  Hypoxemia. EXAM: PORTABLE CHEST 1 VIEW COMPARISON:  Chest x-ray 08/11/2021 FINDINGS: Tracheostomy surgical changes. The heart and mediastinal contours are unchanged. Interval development of a hazy airspace opacity within the left lower lobe. No pulmonary edema. Interval development of a trace left pleural effusion. No right pleural effusion. No pneumothorax. No acute osseous abnormality. IMPRESSION: Hazy left lower lobe airspace opacity as well as trace left pleural effusion suggestive of infection/inflammation. Electronically Signed   By: Tish Frederickson M.D.   On: 08/16/2021 16:53    Assessment/Plan Active Problems:   Acute on chronic respiratory failure with hypoxia (HCC)   Transverse myelitis (HCC)   Septic shock (HCC)   Acute stroke of basal ganglia (HCC)   Acute metabolic encephalopathy   Tracheostomy dependence (HCC)   Acute on chronic respiratory failure with hypoxia we will continue OT collar trials titrate oxygen as tolerated continue secretion management pulmonary toilet. Transverse myelitis no change Acute stroke patient remains nonverbal unresponsive Metabolic encephalopathy no change Tracheostomy remains in place right now Severe sepsis treated with antibiotic   I have personally seen and evaluated the patient, evaluated laboratory and imaging results, formulated the assessment and plan and placed orders.  The Patient requires high complexity decision making with multiple systems involvement.  Rounds were done with the Respiratory Therapy Director and Staff therapists and discussed with nursing staff also.  Allyne Gee, MD [facility] Pulmonary Critical Care Medicine Sleep Medicine

## 2021-08-19 DIAGNOSIS — G9341 Metabolic encephalopathy: Secondary | ICD-10-CM | POA: Diagnosis not present

## 2021-08-19 DIAGNOSIS — A419 Sepsis, unspecified organism: Secondary | ICD-10-CM | POA: Diagnosis not present

## 2021-08-19 DIAGNOSIS — I639 Cerebral infarction, unspecified: Secondary | ICD-10-CM | POA: Diagnosis not present

## 2021-08-19 DIAGNOSIS — J9621 Acute and chronic respiratory failure with hypoxia: Secondary | ICD-10-CM | POA: Diagnosis not present

## 2021-08-19 LAB — CULTURE, BLOOD (ROUTINE X 2)
Culture: NO GROWTH
Culture: NO GROWTH
Special Requests: ADEQUATE

## 2021-08-19 LAB — MAGNESIUM: Magnesium: 1.9 mg/dL (ref 1.7–2.4)

## 2021-08-19 NOTE — Progress Notes (Signed)
Pulmonary Critical Care Medicine [facility] [place]   PULMONARY CRITICAL CARE SERVICE  PROGRESS NOTE     Dominic Benjamin  WGN:[id number]  DOB: 09/29/1966   DOA: 2021-09-06  Referring Physician: Luna Kitchens, MD  HPI: Dominic Benjamin is a 55 y.o. male being followed for ventilator/airway/oxygen weaning Acute on Chronic Respiratory Failure.  Patient is afebrile right now comfortable without any distress at this time  Medications: Reviewed on Rounds  Physical Exam:  Vitals: Temperature 97.3 pulse 70 respiratory 27 blood pressure is 98/49 saturations 95%  Ventilator Settings off the ventilator on T collar secretions are still copious  General: Comfortable at this time Neck: supple Cardiovascular: no malignant arrhythmias Respiratory: Scattered rhonchi noted bilaterally Skin: no rash seen on limited exam Musculoskeletal: No gross abnormality Psychiatric:unable to assess Neurologic:no involuntary movements         Lab Data:   Basic Metabolic Panel: Recent Labs  Lab 08/15/21 0808 08/16/21 0649 08/18/21 0605  NA 138  --  137  K 5.0 4.5 4.2  CL 105  --  105  CO2 25  --  25  GLUCOSE 169*  --  155*  BUN 28*  --  22*  CREATININE <0.30*  --  <0.30*  CALCIUM 8.9  --  8.3*  MG 1.9  --  1.7    ABG: No results for input(s): PHART, PCO2ART, PO2ART, HCO3, O2SAT in the last 168 hours.  Liver Function Tests: No results for input(s): AST, ALT, ALKPHOS, BILITOT, PROT, ALBUMIN in the last 168 hours. No results for input(s): LIPASE, AMYLASE in the last 168 hours. No results for input(s): AMMONIA in the last 168 hours.  CBC: Recent Labs  Lab 08/15/21 0808 08/18/21 0605  WBC 10.7* 10.9*  HGB 10.3* 10.1*  HCT 33.8* 32.7*  MCV 92.9 94.0  PLT 126* 96*    Cardiac Enzymes: No results for input(s): CKTOTAL, CKMB, CKMBINDEX, TROPONINI in the last 168 hours.  BNP (last 3 results) No results for input(s): BNP in the last 8760 hours.  ProBNP (last 3  results) No results for input(s): PROBNP in the last 8760 hours.  Radiological Exams: No results found.  Assessment/Plan Active Problems:   Acute on chronic respiratory failure with hypoxia (HCC)   Transverse myelitis (HCC)   Septic shock (HCC)   Acute stroke of basal ganglia (HCC)   Acute metabolic encephalopathy   Tracheostomy dependence (HCC)   Acute on chronic respiratory failure hypoxia patient currently is on T collar on 35% FiO2 good saturations are noted Transverse myelitis no change continue with supportive care Acute stroke supportive care Sepsis with shock treated resolved Metabolic encephalopathy no change Tracheostomy remains in place   I have personally seen and evaluated the patient, evaluated laboratory and imaging results, formulated the assessment and plan and placed orders. The Patient requires high complexity decision making with multiple systems involvement.  Rounds were done with the Respiratory Therapy Director and Staff therapists and discussed with nursing staff also.  Dominic Pax, MD [facility] Pulmonary Critical Care Medicine Sleep Medicine

## 2021-08-20 ENCOUNTER — Other Ambulatory Visit (HOSPITAL_COMMUNITY): Payer: Self-pay

## 2021-08-20 DIAGNOSIS — J9621 Acute and chronic respiratory failure with hypoxia: Secondary | ICD-10-CM | POA: Diagnosis not present

## 2021-08-20 DIAGNOSIS — I639 Cerebral infarction, unspecified: Secondary | ICD-10-CM | POA: Diagnosis not present

## 2021-08-20 DIAGNOSIS — G9341 Metabolic encephalopathy: Secondary | ICD-10-CM | POA: Diagnosis not present

## 2021-08-20 DIAGNOSIS — A419 Sepsis, unspecified organism: Secondary | ICD-10-CM | POA: Diagnosis not present

## 2021-08-20 LAB — BLOOD GAS, ARTERIAL
Acid-Base Excess: 4.1 mmol/L — ABNORMAL HIGH (ref 0.0–2.0)
Bicarbonate: 28.1 mmol/L — ABNORMAL HIGH (ref 20.0–28.0)
FIO2: 98
O2 Saturation: 96.2 %
Patient temperature: 37
pCO2 arterial: 42.1 mmHg (ref 32.0–48.0)
pH, Arterial: 7.439 (ref 7.350–7.450)
pO2, Arterial: 77.7 mmHg — ABNORMAL LOW (ref 83.0–108.0)

## 2021-08-20 NOTE — Progress Notes (Signed)
Pulmonary Critical Care Medicine [facility] [place]   PULMONARY CRITICAL CARE SERVICE  PROGRESS NOTE     Dominic Benjamin  CBJ:[id number]  DOB: January 03, 1966   DOA: 07/22/2021  Referring Physician: Luna Kitchens, MD  HPI: Dominic Benjamin is a 55 y.o. male being followed for ventilator/airway/oxygen weaning Acute on Chronic Respiratory Failure.  Patient currently is on T collar requiring 60% FiO2 saturations are good so we could try to maybe wean FiO2 down  Medications: Reviewed on Rounds  Physical Exam:  Vitals: Temperature is 97.7 pulse 95 respiratory rate is 28 blood pressure 106/55 saturations 96%  Ventilator Settings on T collar FiO2 60%  General: Comfortable at this time Neck: supple Cardiovascular: no malignant arrhythmias Respiratory: Scattered rhonchi expansion is equal Skin: no rash seen on limited exam Musculoskeletal: No gross abnormality Psychiatric:unable to assess Neurologic:no involuntary movements         Lab Data:   Basic Metabolic Panel: Recent Labs  Lab 08/15/21 0808 08/16/21 0649 08/18/21 0605 08/19/21 1022  NA 138  --  137  --   K 5.0 4.5 4.2  --   CL 105  --  105  --   CO2 25  --  25  --   GLUCOSE 169*  --  155*  --   BUN 28*  --  22*  --   CREATININE <0.30*  --  <0.30*  --   CALCIUM 8.9  --  8.3*  --   MG 1.9  --  1.7 1.9    ABG: No results for input(s): PHART, PCO2ART, PO2ART, HCO3, O2SAT in the last 168 hours.  Liver Function Tests: No results for input(s): AST, ALT, ALKPHOS, BILITOT, PROT, ALBUMIN in the last 168 hours. No results for input(s): LIPASE, AMYLASE in the last 168 hours. No results for input(s): AMMONIA in the last 168 hours.  CBC: Recent Labs  Lab 08/15/21 0808 08/18/21 0605  WBC 10.7* 10.9*  HGB 10.3* 10.1*  HCT 33.8* 32.7*  MCV 92.9 94.0  PLT 126* 96*    Cardiac Enzymes: No results for input(s): CKTOTAL, CKMB, CKMBINDEX, TROPONINI in the last 168 hours.  BNP (last 3 results) No  results for input(s): BNP in the last 8760 hours.  ProBNP (last 3 results) No results for input(s): PROBNP in the last 8760 hours.  Radiological Exams: No results found.  Assessment/Plan Active Problems:   Acute on chronic respiratory failure with hypoxia (HCC)   Transverse myelitis (HCC)   Septic shock (HCC)   Acute stroke of basal ganglia (HCC)   Acute metabolic encephalopathy   Tracheostomy dependence (HCC)   Acute on chronic respiratory failure with hypoxia we will continue with the T-piece titrate oxygen as tolerated and continue pulmonary toilet FiO2 should be decreased Transverse myelitis no change we will continue with supportive care Sepsis with shock resolved hemodynamics are stable Metabolic encephalopathy no change we will continue present therapy Tracheostomy remains in place we will continue to follow Acute stroke has had no improvement noted   I have personally seen and evaluated the patient, evaluated laboratory and imaging results, formulated the assessment and plan and placed orders. The Patient requires high complexity decision making with multiple systems involvement.  Rounds were done with the Respiratory Therapy Director and Staff therapists and discussed with nursing staff also.  Yevonne Pax, MD [facility] Pulmonary Critical Care Medicine Sleep Medicine

## 2021-08-20 DEATH — deceased

## 2021-08-21 LAB — BASIC METABOLIC PANEL
Anion gap: 10 (ref 5–15)
BUN: 20 mg/dL (ref 6–20)
CO2: 27 mmol/L (ref 22–32)
Calcium: 8 mg/dL — ABNORMAL LOW (ref 8.9–10.3)
Chloride: 97 mmol/L — ABNORMAL LOW (ref 98–111)
Creatinine, Ser: <0.3 mg/dL — ABNORMAL LOW (ref 0.61–1.24)
Glucose, Bld: 258 mg/dL — ABNORMAL HIGH (ref 70–99)
Potassium: 4.7 mmol/L (ref 3.5–5.1)
Sodium: 134 mmol/L — ABNORMAL LOW (ref 135–145)

## 2021-08-21 LAB — CBC
HCT: 37 % — ABNORMAL LOW (ref 39.0–52.0)
Hemoglobin: 11.4 g/dL — ABNORMAL LOW (ref 13.0–17.0)
MCH: 28.9 pg (ref 26.0–34.0)
MCHC: 30.8 g/dL (ref 30.0–36.0)
MCV: 93.7 fL (ref 80.0–100.0)
Platelets: 109 10*3/uL — ABNORMAL LOW (ref 150–400)
RBC: 3.95 MIL/uL — ABNORMAL LOW (ref 4.22–5.81)
RDW: 20.1 % — ABNORMAL HIGH (ref 11.5–15.5)
WBC: 19.7 10*3/uL — ABNORMAL HIGH (ref 4.0–10.5)
nRBC: 0.2 % (ref 0.0–0.2)

## 2021-08-21 LAB — MAGNESIUM: Magnesium: 1.7 mg/dL (ref 1.7–2.4)

## 2021-08-22 DIAGNOSIS — J9621 Acute and chronic respiratory failure with hypoxia: Secondary | ICD-10-CM | POA: Diagnosis not present

## 2021-08-22 DIAGNOSIS — G9341 Metabolic encephalopathy: Secondary | ICD-10-CM | POA: Diagnosis not present

## 2021-08-22 DIAGNOSIS — A419 Sepsis, unspecified organism: Secondary | ICD-10-CM | POA: Diagnosis not present

## 2021-08-22 DIAGNOSIS — I639 Cerebral infarction, unspecified: Secondary | ICD-10-CM | POA: Diagnosis not present

## 2021-08-22 LAB — CULTURE, RESPIRATORY W GRAM STAIN: Culture: NORMAL

## 2021-08-22 LAB — VANCOMYCIN, TROUGH: Vancomycin Tr: 22 ug/mL (ref 15–20)

## 2021-08-22 NOTE — Progress Notes (Signed)
Pulmonary Critical Care Medicine [facility] [place]   PULMONARY CRITICAL CARE SERVICE  PROGRESS NOTE     Constantino Starace  QPR:[id number]  DOB: Dec 06, 1965   DOA: 2021-08-15  Referring Physician: Luna Kitchens, MD  HPI: Jarad Barth is a 55 y.o. male being followed for ventilator/airway/oxygen weaning Acute on Chronic Respiratory Failure.  Patient is comfortable right now without distress at this time.  Has been on T collar  Medications: Reviewed on Rounds  Physical Exam:  Vitals: Temperature is 99.1 pulse 75 respiratory is 30 blood pressure is 93/54 saturations 99%  Ventilator Settings on T collar currently  General: Comfortable at this time Neck: supple Cardiovascular: no malignant arrhythmias Respiratory: Scattered rhonchi expansion is equal Skin: no rash seen on limited exam Musculoskeletal: No gross abnormality Psychiatric:unable to assess Neurologic:no involuntary movements         Lab Data:   Basic Metabolic Panel: Recent Labs  Lab 08/16/21 0649 08/18/21 0605 08/19/21 1022 08/21/21 0450  NA  --  137  --  134*  K 4.5 4.2  --  4.7  CL  --  105  --  97*  CO2  --  25  --  27  GLUCOSE  --  155*  --  258*  BUN  --  22*  --  20  CREATININE  --  <0.30*  --  <0.30*  CALCIUM  --  8.3*  --  8.0*  MG  --  1.7 1.9 1.7    ABG: Recent Labs  Lab 08/20/21 1325  PHART 7.439  PCO2ART 42.1  PO2ART 77.7*  HCO3 28.1*  O2SAT 96.2    Liver Function Tests: No results for input(s): AST, ALT, ALKPHOS, BILITOT, PROT, ALBUMIN in the last 168 hours. No results for input(s): LIPASE, AMYLASE in the last 168 hours. No results for input(s): AMMONIA in the last 168 hours.  CBC: Recent Labs  Lab 08/18/21 0605 08/21/21 0450  WBC 10.9* 19.7*  HGB 10.1* 11.4*  HCT 32.7* 37.0*  MCV 94.0 93.7  PLT 96* 109*    Cardiac Enzymes: No results for input(s): CKTOTAL, CKMB, CKMBINDEX, TROPONINI in the last 168 hours.  BNP (last 3 results) No results  for input(s): BNP in the last 8760 hours.  ProBNP (last 3 results) No results for input(s): PROBNP in the last 8760 hours.  Radiological Exams: No results found.  Assessment/Plan Active Problems:   Acute on chronic respiratory failure with hypoxia (HCC)   Transverse myelitis (HCC)   Septic shock (HCC)   Acute stroke of basal ganglia (HCC)   Acute metabolic encephalopathy   Tracheostomy dependence (HCC)   Acute on chronic respiratory failure with hypoxia patient currently is on T collar good saturations are noted plan is to continue with aggressive pulmonary toilet. Transverse myelitis no change continue with supportive care Acute stroke supportive care we will continue to monitor Metabolic encephalopathy no change Tracheostomy dependent we will continue to follow along closely   I have personally seen and evaluated the patient, evaluated laboratory and imaging results, formulated the assessment and plan and placed orders. The Patient requires high complexity decision making with multiple systems involvement.  Rounds were done with the Respiratory Therapy Director and Staff therapists and discussed with nursing staff also.  Yevonne Pax, MD [facility] Pulmonary Critical Care Medicine Sleep Medicine

## 2021-08-23 ENCOUNTER — Other Ambulatory Visit (HOSPITAL_COMMUNITY): Payer: Self-pay

## 2021-08-23 DIAGNOSIS — A419 Sepsis, unspecified organism: Secondary | ICD-10-CM | POA: Diagnosis not present

## 2021-08-23 DIAGNOSIS — G9341 Metabolic encephalopathy: Secondary | ICD-10-CM | POA: Diagnosis not present

## 2021-08-23 DIAGNOSIS — I639 Cerebral infarction, unspecified: Secondary | ICD-10-CM | POA: Diagnosis not present

## 2021-08-23 DIAGNOSIS — J9621 Acute and chronic respiratory failure with hypoxia: Secondary | ICD-10-CM | POA: Diagnosis not present

## 2021-08-23 NOTE — Progress Notes (Signed)
Pulmonary Critical Care Medicine [facility] [place]   PULMONARY CRITICAL CARE SERVICE  PROGRESS NOTE     Dominic Benjamin  RUE:[id number]  DOB: Apr 17, 1966   DOA: 07/26/2021  Referring Physician: Luna Kitchens, MD  HPI: Dominic Benjamin is a 55 y.o. male being followed for ventilator/airway/oxygen weaning Acute on Chronic Respiratory Failure.  Patient is on T collar currently on 40% FiO2  Medications: Reviewed on Rounds  Physical Exam:  Vitals: Temperature is 98.6 pulse 62 respiratory 25 blood pressure is 110/61 saturations 98%  Ventilator Settings off ventilator on T collar FiO2 40%  General: Comfortable at this time Neck: supple Cardiovascular: no malignant arrhythmias Respiratory: Scattered rhonchi expansion is equal Skin: no rash seen on limited exam Musculoskeletal: No gross abnormality Psychiatric:unable to assess Neurologic:no involuntary movements         Lab Data:   Basic Metabolic Panel: Recent Labs  Lab 08/18/21 0605 08/19/21 1022 08/21/21 0450  NA 137  --  134*  K 4.2  --  4.7  CL 105  --  97*  CO2 25  --  27  GLUCOSE 155*  --  258*  BUN 22*  --  20  CREATININE <0.30*  --  <0.30*  CALCIUM 8.3*  --  8.0*  MG 1.7 1.9 1.7    ABG: Recent Labs  Lab 08/20/21 1325  PHART 7.439  PCO2ART 42.1  PO2ART 77.7*  HCO3 28.1*  O2SAT 96.2    Liver Function Tests: No results for input(s): AST, ALT, ALKPHOS, BILITOT, PROT, ALBUMIN in the last 168 hours. No results for input(s): LIPASE, AMYLASE in the last 168 hours. No results for input(s): AMMONIA in the last 168 hours.  CBC: Recent Labs  Lab 08/18/21 0605 08/21/21 0450  WBC 10.9* 19.7*  HGB 10.1* 11.4*  HCT 32.7* 37.0*  MCV 94.0 93.7  PLT 96* 109*    Cardiac Enzymes: No results for input(s): CKTOTAL, CKMB, CKMBINDEX, TROPONINI in the last 168 hours.  BNP (last 3 results) No results for input(s): BNP in the last 8760 hours.  ProBNP (last 3 results) No results for  input(s): PROBNP in the last 8760 hours.  Radiological Exams: No results found.  Assessment/Plan Active Problems:   Acute on chronic respiratory failure with hypoxia (HCC)   Transverse myelitis (HCC)   Septic shock (HCC)   Acute stroke of basal ganglia (HCC)   Acute metabolic encephalopathy   Tracheostomy dependence (HCC)   Acute on chronic respiratory failure with hypoxia continues with T collar patient right now is on 40% FiO2 Acute stroke no change supportive care Transverse myelitis again no change Sepsis with shock treated resolved Metabolic encephalopathy patient has had no improvement Tracheostomy remains in place for airway protection   I have personally seen and evaluated the patient, evaluated laboratory and imaging results, formulated the assessment and plan and placed orders. The Patient requires high complexity decision making with multiple systems involvement.  Rounds were done with the Respiratory Therapy Director and Staff therapists and discussed with nursing staff also.  Yevonne Pax, MD [facility] Pulmonary Critical Care Medicine Sleep Medicine

## 2021-08-24 DIAGNOSIS — G9341 Metabolic encephalopathy: Secondary | ICD-10-CM | POA: Diagnosis not present

## 2021-08-24 DIAGNOSIS — I639 Cerebral infarction, unspecified: Secondary | ICD-10-CM | POA: Diagnosis not present

## 2021-08-24 DIAGNOSIS — A419 Sepsis, unspecified organism: Secondary | ICD-10-CM | POA: Diagnosis not present

## 2021-08-24 DIAGNOSIS — J9621 Acute and chronic respiratory failure with hypoxia: Secondary | ICD-10-CM | POA: Diagnosis not present

## 2021-08-24 LAB — CBC
HCT: 33.4 % — ABNORMAL LOW (ref 39.0–52.0)
Hemoglobin: 10.6 g/dL — ABNORMAL LOW (ref 13.0–17.0)
MCH: 29.5 pg (ref 26.0–34.0)
MCHC: 31.7 g/dL (ref 30.0–36.0)
MCV: 93 fL (ref 80.0–100.0)
Platelets: 120 10*3/uL — ABNORMAL LOW (ref 150–400)
RBC: 3.59 MIL/uL — ABNORMAL LOW (ref 4.22–5.81)
RDW: 19.9 % — ABNORMAL HIGH (ref 11.5–15.5)
WBC: 11.2 10*3/uL — ABNORMAL HIGH (ref 4.0–10.5)
nRBC: 0 % (ref 0.0–0.2)

## 2021-08-24 LAB — BASIC METABOLIC PANEL
Anion gap: 8 (ref 5–15)
BUN: 15 mg/dL (ref 6–20)
CO2: 29 mmol/L (ref 22–32)
Calcium: 8.2 mg/dL — ABNORMAL LOW (ref 8.9–10.3)
Chloride: 101 mmol/L (ref 98–111)
Creatinine, Ser: <0.3 mg/dL — ABNORMAL LOW (ref 0.61–1.24)
Glucose, Bld: 200 mg/dL — ABNORMAL HIGH (ref 70–99)
Potassium: 4.3 mmol/L (ref 3.5–5.1)
Sodium: 138 mmol/L (ref 135–145)

## 2021-08-24 LAB — VANCOMYCIN, TROUGH: Vancomycin Tr: 14 ug/mL — ABNORMAL LOW (ref 15–20)

## 2021-08-24 LAB — MAGNESIUM: Magnesium: 1.6 mg/dL — ABNORMAL LOW (ref 1.7–2.4)

## 2021-08-24 NOTE — Progress Notes (Signed)
Pulmonary Critical Care Medicine [facility] [place]   PULMONARY CRITICAL CARE SERVICE  PROGRESS NOTE     Dominic Benjamin  ZOX:[id number]  DOB: 1965/12/31   DOA: 25-Aug-2021  Referring Physician: Luna Kitchens, MD  HPI: Dominic Benjamin is a 55 y.o. male being followed for ventilator/airway/oxygen weaning Acute on Chronic Respiratory Failure.  Remains on T collar has been comfortable without distress patient did have a low-grade fever noted  Medications: Reviewed on Rounds  Physical Exam:  Vitals: Temperature is 99.8 pulse 72 respiratory 26 blood pressure is 97/53 saturations 97%  Ventilator Settings off ventilator on T collar  General: Comfortable at this time Neck: supple Cardiovascular: no malignant arrhythmias Respiratory: Coarse rhonchi noted with excessive secretions. Skin: no rash seen on limited exam Musculoskeletal: No gross abnormality Psychiatric:unable to assess Neurologic:no involuntary movements         Lab Data:   Basic Metabolic Panel: Recent Labs  Lab 08/18/21 0605 08/19/21 1022 08/21/21 0450 08/24/21 0337  NA 137  --  134* 138  K 4.2  --  4.7 4.3  CL 105  --  97* 101  CO2 25  --  27 29  GLUCOSE 155*  --  258* 200*  BUN 22*  --  20 15  CREATININE <0.30*  --  <0.30* <0.30*  CALCIUM 8.3*  --  8.0* 8.2*  MG 1.7 1.9 1.7 1.6*    ABG: Recent Labs  Lab 08/20/21 1325  PHART 7.439  PCO2ART 42.1  PO2ART 77.7*  HCO3 28.1*  O2SAT 96.2    Liver Function Tests: No results for input(s): AST, ALT, ALKPHOS, BILITOT, PROT, ALBUMIN in the last 168 hours. No results for input(s): LIPASE, AMYLASE in the last 168 hours. No results for input(s): AMMONIA in the last 168 hours.  CBC: Recent Labs  Lab 08/18/21 0605 08/21/21 0450 08/24/21 0337  WBC 10.9* 19.7* 11.2*  HGB 10.1* 11.4* 10.6*  HCT 32.7* 37.0* 33.4*  MCV 94.0 93.7 93.0  PLT 96* 109* 120*    Cardiac Enzymes: No results for input(s): CKTOTAL, CKMB, CKMBINDEX,  TROPONINI in the last 168 hours.  BNP (last 3 results) No results for input(s): BNP in the last 8760 hours.  ProBNP (last 3 results) No results for input(s): PROBNP in the last 8760 hours.  Radiological Exams: DG Chest Port 1 View  Result Date: 08/23/2021 CLINICAL DATA:  Respiratory failure. EXAM: PORTABLE CHEST 1 VIEW COMPARISON:  Chest x-ray dated August 20, 2021. FINDINGS: Unchanged tracheostomy tube. Worsening right lower lobe consolidation. Probable small right pleural effusion. The left lung is clear. No pneumothorax. No acute osseous abnormality. IMPRESSION: 1. Worsening right lower lobe pneumonia. Electronically Signed   By: Obie Dredge M.D.   On: 08/23/2021 16:13    Assessment/Plan Active Problems:   Acute on chronic respiratory failure with hypoxia (HCC)   Transverse myelitis (HCC)   Septic shock (HCC)   Acute stroke of basal ganglia (HCC)   Acute metabolic encephalopathy   Tracheostomy dependence (HCC)   Acute on chronic respiratory failure with hypoxia change tracheostomy #6 cuffless trach secretions will require ongoing pulmonary toilet Acute stroke there has been no improvement since the patient had a acute stroke Sepsis with shock hemodynamics are stable we will continue to monitor Tracheostomy will need to remain in place for pulmonary toileting Transverse myelitis no change   I have personally seen and evaluated the patient, evaluated laboratory and imaging results, formulated the assessment and plan and placed orders. The Patient requires high complexity  decision making with multiple systems involvement.  Rounds were done with the Respiratory Therapy Director and Staff therapists and discussed with nursing staff also.  Allyne Gee, MD [facility] Pulmonary Critical Care Medicine Sleep Medicine

## 2021-08-25 ENCOUNTER — Other Ambulatory Visit (HOSPITAL_COMMUNITY): Payer: Self-pay

## 2021-08-25 DIAGNOSIS — A419 Sepsis, unspecified organism: Secondary | ICD-10-CM | POA: Diagnosis not present

## 2021-08-25 DIAGNOSIS — I639 Cerebral infarction, unspecified: Secondary | ICD-10-CM | POA: Diagnosis not present

## 2021-08-25 DIAGNOSIS — G9341 Metabolic encephalopathy: Secondary | ICD-10-CM | POA: Diagnosis not present

## 2021-08-25 DIAGNOSIS — J9621 Acute and chronic respiratory failure with hypoxia: Secondary | ICD-10-CM | POA: Diagnosis not present

## 2021-08-25 NOTE — Progress Notes (Signed)
Pulmonary Critical Care Medicine [facility] [place]   PULMONARY CRITICAL CARE SERVICE  PROGRESS NOTE     Dominic Benjamin  HQI:[id number]  DOB: 10-31-1966   DOA: 2021-08-28  Referring Physician: Luna Kitchens, MD  HPI: Dominic Benjamin is a 55 y.o. male being followed for ventilator/airway/oxygen weaning Acute on Chronic Respiratory Failure.  Patient is comfortable right now without distress has been on T collar currently oxygen has been increased likely secondary to mucous plug.  The patient's chest film did not show anything significant in terms of infiltrates and was read as no acute process  Medications: Reviewed on Rounds  Physical Exam:  Vitals: Temperature is 97.8 pulse 82 respiratory is 27 blood pressure is 99/57 saturations 97%  Ventilator Settings on T collar currently was on 98% FiO2  General: Comfortable at this time Neck: supple Cardiovascular: no malignant arrhythmias Respiratory: Coarse breath sounds are noted bilaterally. Skin: no rash seen on limited exam Musculoskeletal: No gross abnormality Psychiatric:unable to assess Neurologic:no involuntary movements         Lab Data:   Basic Metabolic Panel: Recent Labs  Lab 08/19/21 1022 08/21/21 0450 08/24/21 0337  NA  --  134* 138  K  --  4.7 4.3  CL  --  97* 101  CO2  --  27 29  GLUCOSE  --  258* 200*  BUN  --  20 15  CREATININE  --  <0.30* <0.30*  CALCIUM  --  8.0* 8.2*  MG 1.9 1.7 1.6*    ABG: Recent Labs  Lab 08/20/21 1325  PHART 7.439  PCO2ART 42.1  PO2ART 77.7*  HCO3 28.1*  O2SAT 96.2    Liver Function Tests: No results for input(s): AST, ALT, ALKPHOS, BILITOT, PROT, ALBUMIN in the last 168 hours. No results for input(s): LIPASE, AMYLASE in the last 168 hours. No results for input(s): AMMONIA in the last 168 hours.  CBC: Recent Labs  Lab 08/21/21 0450 08/24/21 0337  WBC 19.7* 11.2*  HGB 11.4* 10.6*  HCT 37.0* 33.4*  MCV 93.7 93.0  PLT 109* 120*     Cardiac Enzymes: No results for input(s): CKTOTAL, CKMB, CKMBINDEX, TROPONINI in the last 168 hours.  BNP (last 3 results) No results for input(s): BNP in the last 8760 hours.  ProBNP (last 3 results) No results for input(s): PROBNP in the last 8760 hours.  Radiological Exams: DG Chest Port 1 View  Result Date: 08/25/2021 CLINICAL DATA:  Reason for exam: respiratory failure Patient non verbal throughout exam. No known medical hx. EXAM: PORTABLE CHEST 1 VIEW COMPARISON:  08/23/2021 FINDINGS: Tracheostomy tube and cardiac silhouette unchanged. Lungs are hyperinflated. No effusion, infiltrate, or pneumothorax. IMPRESSION: No acute cardiopulmonary process. Electronically Signed   By: Genevive Bi M.D.   On: 08/25/2021 07:37   DG Chest Port 1 View  Result Date: 08/23/2021 CLINICAL DATA:  Respiratory failure. EXAM: PORTABLE CHEST 1 VIEW COMPARISON:  Chest x-ray dated August 20, 2021. FINDINGS: Unchanged tracheostomy tube. Worsening right lower lobe consolidation. Probable small right pleural effusion. The left lung is clear. No pneumothorax. No acute osseous abnormality. IMPRESSION: 1. Worsening right lower lobe pneumonia. Electronically Signed   By: Obie Dredge M.D.   On: 08/23/2021 16:13    Assessment/Plan Active Problems:   Acute on chronic respiratory failure with hypoxia (HCC)   Transverse myelitis (HCC)   Septic shock (HCC)   Acute stroke of basal ganglia (HCC)   Acute metabolic encephalopathy   Tracheostomy dependence (HCC)   Acute on  chronic respiratory failure with hypoxia suggest getting a blood gas also try to titrate the oxygen back down if patient is saturations are remaining at the current levels after the ABG is done.  Chest x-ray results were personally reviewed by me Transverse myelitis no change we will continue with supportive care Acute stroke no change continue present management Metabolic encephalopathy patient is at baseline Tracheostomy dependent no  changes at this time we will continue to follow along   I have personally seen and evaluated the patient, evaluated laboratory and imaging results, formulated the assessment and plan and placed orders. The Patient requires high complexity decision making with multiple systems involvement.  Rounds were done with the Respiratory Therapy Director and Staff therapists and discussed with nursing staff also.  Yevonne Pax, MD [facility] Pulmonary Critical Care Medicine Sleep Medicine

## 2021-08-26 DIAGNOSIS — J9621 Acute and chronic respiratory failure with hypoxia: Secondary | ICD-10-CM | POA: Diagnosis not present

## 2021-08-26 DIAGNOSIS — I639 Cerebral infarction, unspecified: Secondary | ICD-10-CM | POA: Diagnosis not present

## 2021-08-26 DIAGNOSIS — A419 Sepsis, unspecified organism: Secondary | ICD-10-CM | POA: Diagnosis not present

## 2021-08-26 DIAGNOSIS — G9341 Metabolic encephalopathy: Secondary | ICD-10-CM | POA: Diagnosis not present

## 2021-08-26 LAB — BASIC METABOLIC PANEL
Anion gap: 6 (ref 5–15)
BUN: 26 mg/dL — ABNORMAL HIGH (ref 6–20)
CO2: 29 mmol/L (ref 22–32)
Calcium: 8.5 mg/dL — ABNORMAL LOW (ref 8.9–10.3)
Chloride: 101 mmol/L (ref 98–111)
Creatinine, Ser: <0.3 mg/dL — ABNORMAL LOW (ref 0.61–1.24)
Glucose, Bld: 239 mg/dL — ABNORMAL HIGH (ref 70–99)
Potassium: 4.8 mmol/L (ref 3.5–5.1)
Sodium: 136 mmol/L (ref 135–145)

## 2021-08-26 LAB — CBC
HCT: 33.2 % — ABNORMAL LOW (ref 39.0–52.0)
Hemoglobin: 9.8 g/dL — ABNORMAL LOW (ref 13.0–17.0)
MCH: 28.1 pg (ref 26.0–34.0)
MCHC: 29.5 g/dL — ABNORMAL LOW (ref 30.0–36.0)
MCV: 95.1 fL (ref 80.0–100.0)
Platelets: 155 10*3/uL (ref 150–400)
RBC: 3.49 MIL/uL — ABNORMAL LOW (ref 4.22–5.81)
RDW: 19.6 % — ABNORMAL HIGH (ref 11.5–15.5)
WBC: 16 10*3/uL — ABNORMAL HIGH (ref 4.0–10.5)
nRBC: 0.1 % (ref 0.0–0.2)

## 2021-08-26 NOTE — Progress Notes (Signed)
Pulmonary Critical Care Medicine [facility] [place]   PULMONARY CRITICAL CARE SERVICE  PROGRESS NOTE     Dominic Benjamin  [id number]  DOB: 1966-06-06   DOA: 08/04/2021  Referring Physician: Luna Kitchens, MD  HPI: Dominic Benjamin is a 55 y.o. male being followed for ventilator/airway/oxygen weaning Acute on Chronic Respiratory Failure.  Patient currently is on T collar is on 40% FiO2 some desaturations of the noted secretions are moderate  Medications: Reviewed on Rounds  Physical Exam:  Vitals: Temperature 97.1 pulse 57 respiratory 25 blood pressure is 100/57 saturations 100%  Ventilator Settings on T collar FiO2 is 40%  General: Comfortable at this time Neck: supple Cardiovascular: no malignant arrhythmias Respiratory: No rhonchi very coarse breath sounds Skin: no rash seen on limited exam Musculoskeletal: No gross abnormality Psychiatric:unable to assess Neurologic:no involuntary movements         Lab Data:   Basic Metabolic Panel: Recent Labs  Lab 08/19/21 1022 08/21/21 0450 08/24/21 0337 08/26/21 0352  NA  --  134* 138 136  K  --  4.7 4.3 4.8  CL  --  97* 101 101  CO2  --  27 29 29   GLUCOSE  --  258* 200* 239*  BUN  --  20 15 26*  CREATININE  --  <0.30* <0.30* <0.30*  CALCIUM  --  8.0* 8.2* 8.5*  MG 1.9 1.7 1.6*  --     ABG: Recent Labs  Lab 08/20/21 1325  PHART 7.439  PCO2ART 42.1  PO2ART 77.7*  HCO3 28.1*  O2SAT 96.2    Liver Function Tests: No results for input(s): AST, ALT, ALKPHOS, BILITOT, PROT, ALBUMIN in the last 168 hours. No results for input(s): LIPASE, AMYLASE in the last 168 hours. No results for input(s): AMMONIA in the last 168 hours.  CBC: Recent Labs  Lab 08/21/21 0450 08/24/21 0337 08/26/21 0352  WBC 19.7* 11.2* 16.0*  HGB 11.4* 10.6* 9.8*  HCT 37.0* 33.4* 33.2*  MCV 93.7 93.0 95.1  PLT 109* 120* 155    Cardiac Enzymes: No results for input(s): CKTOTAL, CKMB, CKMBINDEX, TROPONINI in the  last 168 hours.  BNP (last 3 results) No results for input(s): BNP in the last 8760 hours.  ProBNP (last 3 results) No results for input(s): PROBNP in the last 8760 hours.  Radiological Exams: DG Chest Port 1 View  Result Date: 08/25/2021 CLINICAL DATA:  Reason for exam: respiratory failure Patient non verbal throughout exam. No known medical hx. EXAM: PORTABLE CHEST 1 VIEW COMPARISON:  08/23/2021 FINDINGS: Tracheostomy tube and cardiac silhouette unchanged. Lungs are hyperinflated. No effusion, infiltrate, or pneumothorax. IMPRESSION: No acute cardiopulmonary process. Electronically Signed   By: 10/23/2021 M.D.   On: 08/25/2021 07:37    Assessment/Plan Active Problems:   Acute on chronic respiratory failure with hypoxia (HCC)   Transverse myelitis (HCC)   Septic shock (HCC)   Acute stroke of basal ganglia (HCC)   Acute metabolic encephalopathy   Tracheostomy dependence (HCC)   Acute on chronic respiratory failure hypoxia we will continue with T-piece 40% oxygen.  Continue secretion management pulmonary toilet. Transverse myelitis no change Severe sepsis with shock hemodynamics are stable Acute stroke overall no change no improvement Metabolic encephalopathy patient remains at baseline   I have personally seen and evaluated the patient, evaluated laboratory and imaging results, formulated the assessment and plan and placed orders. The Patient requires high complexity decision making with multiple systems involvement.  Rounds were done with the Respiratory Therapy Director  and Staff therapists and discussed with nursing staff also.  Allyne Gee, MD [facility] Pulmonary Critical Care Medicine Sleep Medicine

## 2021-08-28 LAB — CBC
HCT: 34 % — ABNORMAL LOW (ref 39.0–52.0)
Hemoglobin: 10.4 g/dL — ABNORMAL LOW (ref 13.0–17.0)
MCH: 28.5 pg (ref 26.0–34.0)
MCHC: 30.6 g/dL (ref 30.0–36.0)
MCV: 93.2 fL (ref 80.0–100.0)
Platelets: 150 10*3/uL (ref 150–400)
RBC: 3.65 MIL/uL — ABNORMAL LOW (ref 4.22–5.81)
RDW: 19.2 % — ABNORMAL HIGH (ref 11.5–15.5)
WBC: 13.6 10*3/uL — ABNORMAL HIGH (ref 4.0–10.5)
nRBC: 0.4 % — ABNORMAL HIGH (ref 0.0–0.2)

## 2021-08-28 LAB — BASIC METABOLIC PANEL
Anion gap: 9 (ref 5–15)
BUN: 19 mg/dL (ref 6–20)
CO2: 29 mmol/L (ref 22–32)
Calcium: 8.6 mg/dL — ABNORMAL LOW (ref 8.9–10.3)
Chloride: 100 mmol/L (ref 98–111)
Creatinine, Ser: <0.3 mg/dL — ABNORMAL LOW (ref 0.61–1.24)
Glucose, Bld: 219 mg/dL — ABNORMAL HIGH (ref 70–99)
Potassium: 4.3 mmol/L (ref 3.5–5.1)
Sodium: 138 mmol/L (ref 135–145)

## 2021-08-28 LAB — CULTURE, RESPIRATORY W GRAM STAIN
Culture: NORMAL
Gram Stain: NONE SEEN

## 2021-08-29 DIAGNOSIS — A419 Sepsis, unspecified organism: Secondary | ICD-10-CM | POA: Diagnosis not present

## 2021-08-29 DIAGNOSIS — G9341 Metabolic encephalopathy: Secondary | ICD-10-CM | POA: Diagnosis not present

## 2021-08-29 DIAGNOSIS — J9621 Acute and chronic respiratory failure with hypoxia: Secondary | ICD-10-CM | POA: Diagnosis not present

## 2021-08-29 DIAGNOSIS — I639 Cerebral infarction, unspecified: Secondary | ICD-10-CM | POA: Diagnosis not present

## 2021-08-29 NOTE — Progress Notes (Signed)
Pulmonary Critical Care Medicine [facility] [place]   PULMONARY CRITICAL CARE SERVICE  PROGRESS NOTE     Khalon Cansler  ZOX:[id number]  DOB: 06-24-1966   DOA: 08/19/2021  Referring Physician: Luna Kitchens, MD  HPI: Madoc Holquin is a 55 y.o. male being followed for ventilator/airway/oxygen weaning Acute on Chronic Respiratory Failure.  Patient is on T collar resting comfortably right now  Medications: Reviewed on Rounds  Physical Exam:  Vitals: Temperature 96.8 pulse 104 respiratory rate 23 blood pressure is 127/67 saturations 94%  Ventilator Settings off ventilator on T collar  General: Comfortable at this time Neck: supple Cardiovascular: no malignant arrhythmias Respiratory: Scattered rhonchi expansion is equal Skin: no rash seen on limited exam Musculoskeletal: No gross abnormality Psychiatric:unable to assess Neurologic:no involuntary movements         Lab Data:   Basic Metabolic Panel: Recent Labs  Lab 08/24/21 0337 08/26/21 0352 08/28/21 0355  NA 138 136 138  K 4.3 4.8 4.3  CL 101 101 100  CO2 29 29 29   GLUCOSE 200* 239* 219*  BUN 15 26* 19  CREATININE <0.30* <0.30* <0.30*  CALCIUM 8.2* 8.5* 8.6*  MG 1.6*  --   --     ABG: No results for input(s): PHART, PCO2ART, PO2ART, HCO3, O2SAT in the last 168 hours.  Liver Function Tests: No results for input(s): AST, ALT, ALKPHOS, BILITOT, PROT, ALBUMIN in the last 168 hours. No results for input(s): LIPASE, AMYLASE in the last 168 hours. No results for input(s): AMMONIA in the last 168 hours.  CBC: Recent Labs  Lab 08/24/21 0337 08/26/21 0352 08/28/21 0355  WBC 11.2* 16.0* 13.6*  HGB 10.6* 9.8* 10.4*  HCT 33.4* 33.2* 34.0*  MCV 93.0 95.1 93.2  PLT 120* 155 150    Cardiac Enzymes: No results for input(s): CKTOTAL, CKMB, CKMBINDEX, TROPONINI in the last 168 hours.  BNP (last 3 results) No results for input(s): BNP in the last 8760 hours.  ProBNP (last 3 results) No  results for input(s): PROBNP in the last 8760 hours.  Radiological Exams: No results found.  Assessment/Plan Active Problems:   Acute on chronic respiratory failure with hypoxia (HCC)   Transverse myelitis (HCC)   Septic shock (HCC)   Acute stroke of basal ganglia (HCC)   Acute metabolic encephalopathy   Tracheostomy dependence (HCC)   Acute on chronic respiratory failure with hypoxia we will continue with T-piece.  Titrate oxygen continue pulmonary toilet. Transverse myelitis no change Sepsis with shock resolved hemodynamics are stable Metabolic encephalopathy no change we will continue to follow along closely Tracheostomy remains in place   I have personally seen and evaluated the patient, evaluated laboratory and imaging results, formulated the assessment and plan and placed orders. The Patient requires high complexity decision making with multiple systems involvement.  Rounds were done with the Respiratory Therapy Director and Staff therapists and discussed with nursing staff also.  10/28/21, MD [facility] Pulmonary Critical Care Medicine Sleep Medicine

## 2021-08-30 ENCOUNTER — Other Ambulatory Visit (HOSPITAL_COMMUNITY): Payer: Self-pay

## 2021-08-30 DIAGNOSIS — G9341 Metabolic encephalopathy: Secondary | ICD-10-CM | POA: Diagnosis not present

## 2021-08-30 DIAGNOSIS — A419 Sepsis, unspecified organism: Secondary | ICD-10-CM | POA: Diagnosis not present

## 2021-08-30 DIAGNOSIS — J9621 Acute and chronic respiratory failure with hypoxia: Secondary | ICD-10-CM | POA: Diagnosis not present

## 2021-08-30 DIAGNOSIS — I639 Cerebral infarction, unspecified: Secondary | ICD-10-CM | POA: Diagnosis not present

## 2021-08-30 LAB — CBC WITH DIFFERENTIAL/PLATELET
Abs Immature Granulocytes: 0.59 10*3/uL — ABNORMAL HIGH (ref 0.00–0.07)
Basophils Absolute: 0.1 10*3/uL (ref 0.0–0.1)
Basophils Relative: 1 %
Eosinophils Absolute: 0 10*3/uL (ref 0.0–0.5)
Eosinophils Relative: 0 %
HCT: 34.7 % — ABNORMAL LOW (ref 39.0–52.0)
Hemoglobin: 10.8 g/dL — ABNORMAL LOW (ref 13.0–17.0)
Immature Granulocytes: 3 %
Lymphocytes Relative: 5 %
Lymphs Abs: 0.9 10*3/uL (ref 0.7–4.0)
MCH: 29.1 pg (ref 26.0–34.0)
MCHC: 31.1 g/dL (ref 30.0–36.0)
MCV: 93.5 fL (ref 80.0–100.0)
Monocytes Absolute: 0.9 10*3/uL (ref 0.1–1.0)
Monocytes Relative: 5 %
Neutro Abs: 17 10*3/uL — ABNORMAL HIGH (ref 1.7–7.7)
Neutrophils Relative %: 86 %
Platelets: 143 10*3/uL — ABNORMAL LOW (ref 150–400)
RBC: 3.71 MIL/uL — ABNORMAL LOW (ref 4.22–5.81)
RDW: 19.6 % — ABNORMAL HIGH (ref 11.5–15.5)
WBC: 19.5 10*3/uL — ABNORMAL HIGH (ref 4.0–10.5)
nRBC: 0.2 % (ref 0.0–0.2)

## 2021-08-30 LAB — URINALYSIS, ROUTINE W REFLEX MICROSCOPIC
Bilirubin Urine: NEGATIVE
Glucose, UA: 150 mg/dL — AB
Ketones, ur: NEGATIVE mg/dL
Nitrite: NEGATIVE
Protein, ur: 100 mg/dL — AB
RBC / HPF: >50 RBC/hpf — ABNORMAL HIGH (ref 0–5)
Specific Gravity, Urine: 1.034 — ABNORMAL HIGH (ref 1.005–1.030)
pH: 6 (ref 5.0–8.0)

## 2021-08-30 NOTE — Progress Notes (Signed)
Pulmonary Critical Care Medicine [facility] [place]   PULMONARY CRITICAL CARE SERVICE  PROGRESS NOTE     Dominic Benjamin  KPT:[id number]  DOB: Jun 17, 1966   DOA: 08/04/2021  Referring Physician: Luna Kitchens, MD  HPI: Dominic Benjamin is a 55 y.o. male being followed for ventilator/airway/oxygen weaning Acute on Chronic Respiratory Failure.  At this time patient is on T collar has been on 60% FiO2 good saturations are noted.  Medications: Reviewed on Rounds  Physical Exam:  Vitals: Temperature 97.7 pulse 85 respiratory to is 30 blood pressure 150/80 saturations 94%  Ventilator Settings currently 60% FiO2 on T collar  General: Comfortable at this time Neck: supple Cardiovascular: no malignant arrhythmias Respiratory: Scattered rhonchi expansion is equal Skin: no rash seen on limited exam Musculoskeletal: No gross abnormality Psychiatric:unable to assess Neurologic:no involuntary movements         Lab Data:   Basic Metabolic Panel: Recent Labs  Lab 08/24/21 0337 08/26/21 0352 08/28/21 0355  NA 138 136 138  K 4.3 4.8 4.3  CL 101 101 100  CO2 29 29 29   GLUCOSE 200* 239* 219*  BUN 15 26* 19  CREATININE <0.30* <0.30* <0.30*  CALCIUM 8.2* 8.5* 8.6*  MG 1.6*  --   --     ABG: No results for input(s): PHART, PCO2ART, PO2ART, HCO3, O2SAT in the last 168 hours.  Liver Function Tests: No results for input(s): AST, ALT, ALKPHOS, BILITOT, PROT, ALBUMIN in the last 168 hours. No results for input(s): LIPASE, AMYLASE in the last 168 hours. No results for input(s): AMMONIA in the last 168 hours.  CBC: Recent Labs  Lab 08/24/21 0337 08/26/21 0352 08/28/21 0355 08/30/21 0357  WBC 11.2* 16.0* 13.6* 19.5*  NEUTROABS  --   --   --  17.0*  HGB 10.6* 9.8* 10.4* 10.8*  HCT 33.4* 33.2* 34.0* 34.7*  MCV 93.0 95.1 93.2 93.5  PLT 120* 155 150 143*    Cardiac Enzymes: No results for input(s): CKTOTAL, CKMB, CKMBINDEX, TROPONINI in the last 168  hours.  BNP (last 3 results) No results for input(s): BNP in the last 8760 hours.  ProBNP (last 3 results) No results for input(s): PROBNP in the last 8760 hours.  Radiological Exams: No results found.  Assessment/Plan Active Problems:   Acute on chronic respiratory failure with hypoxia (HCC)   Transverse myelitis (HCC)   Septic shock (HCC)   Acute stroke of basal ganglia (HCC)   Acute metabolic encephalopathy   Tracheostomy dependence (HCC)   Acute on chronic respiratory failure hypoxia oxygen requirement still quite high plan is going to be to continue with aggressive pulmonary toilet and supportive care titrate oxygen as tolerated follow-up x-rays Transverse myelitis no change overall Sepsis with shock supportive care Acute stroke patient is at baseline Tracheostomy will need to remain in place   I have personally seen and evaluated the patient, evaluated laboratory and imaging results, formulated the assessment and plan and placed orders. The Patient requires high complexity decision making with multiple systems involvement.  Rounds were done with the Respiratory Therapy Director and Staff therapists and discussed with nursing staff also.  10/30/21, MD [facility] Pulmonary Critical Care Medicine Sleep Medicine

## 2021-08-31 DIAGNOSIS — G9341 Metabolic encephalopathy: Secondary | ICD-10-CM | POA: Diagnosis not present

## 2021-08-31 DIAGNOSIS — A419 Sepsis, unspecified organism: Secondary | ICD-10-CM | POA: Diagnosis not present

## 2021-08-31 DIAGNOSIS — I639 Cerebral infarction, unspecified: Secondary | ICD-10-CM | POA: Diagnosis not present

## 2021-08-31 DIAGNOSIS — J9621 Acute and chronic respiratory failure with hypoxia: Secondary | ICD-10-CM | POA: Diagnosis not present

## 2021-08-31 LAB — CBC
HCT: 38.3 % — ABNORMAL LOW (ref 39.0–52.0)
Hemoglobin: 11.9 g/dL — ABNORMAL LOW (ref 13.0–17.0)
MCH: 29 pg (ref 26.0–34.0)
MCHC: 31.1 g/dL (ref 30.0–36.0)
MCV: 93.2 fL (ref 80.0–100.0)
Platelets: 143 10*3/uL — ABNORMAL LOW (ref 150–400)
RBC: 4.11 MIL/uL — ABNORMAL LOW (ref 4.22–5.81)
RDW: 19.4 % — ABNORMAL HIGH (ref 11.5–15.5)
WBC: 22.6 10*3/uL — ABNORMAL HIGH (ref 4.0–10.5)
nRBC: 0 % (ref 0.0–0.2)

## 2021-08-31 NOTE — Progress Notes (Signed)
Pulmonary Critical Care Medicine [facility] [place]   PULMONARY CRITICAL CARE SERVICE  PROGRESS NOTE     Dominic Benjamin  TGG:[id number]  DOB: Feb 16, 1966   DOA: 08/14/2021  Referring Physician: Luna Kitchens, MD  HPI: Dominic Benjamin is a 55 y.o. male being followed for ventilator/airway/oxygen weaning Acute on Chronic Respiratory Failure.  Patient is afebrile right now resting comfortably without distress has been on 98% FiO2.  Patient is DNR not to be placed on the ventilator per instructions.  Spoke with primary care team going to have family goals of care meeting.  I do not expect that patient is going to survive  Medications: Reviewed on Rounds  Physical Exam:  Vitals: Temperature is 96.8 pulse 80 respiratory 20 Blood pressure is 120/77 saturations 100%  Ventilator Settings 98% FiO2  General: Comfortable at this time Neck: supple Cardiovascular: no malignant arrhythmias Respiratory: Coarse breath sounds Skin: no rash seen on limited exam Musculoskeletal: No gross abnormality Psychiatric:unable to assess Neurologic:no involuntary movements         Lab Data:   Basic Metabolic Panel: Recent Labs  Lab 08/26/21 0352 08/28/21 0355  NA 136 138  K 4.8 4.3  CL 101 100  CO2 29 29  GLUCOSE 239* 219*  BUN 26* 19  CREATININE <0.30* <0.30*  CALCIUM 8.5* 8.6*    ABG: No results for input(s): PHART, PCO2ART, PO2ART, HCO3, O2SAT in the last 168 hours.  Liver Function Tests: No results for input(s): AST, ALT, ALKPHOS, BILITOT, PROT, ALBUMIN in the last 168 hours. No results for input(s): LIPASE, AMYLASE in the last 168 hours. No results for input(s): AMMONIA in the last 168 hours.  CBC: Recent Labs  Lab 08/26/21 0352 08/28/21 0355 08/30/21 0357 08/31/21 0337  WBC 16.0* 13.6* 19.5* 22.6*  NEUTROABS  --   --  17.0*  --   HGB 9.8* 10.4* 10.8* 11.9*  HCT 33.2* 34.0* 34.7* 38.3*  MCV 95.1 93.2 93.5 93.2  PLT 155 150 143* 143*    Cardiac  Enzymes: No results for input(s): CKTOTAL, CKMB, CKMBINDEX, TROPONINI in the last 168 hours.  BNP (last 3 results) No results for input(s): BNP in the last 8760 hours.  ProBNP (last 3 results) No results for input(s): PROBNP in the last 8760 hours.  Radiological Exams: DG Chest Port 1 View  Result Date: 08/30/2021 CLINICAL DATA:  Shortness of breath. EXAM: PORTABLE CHEST 1 VIEW COMPARISON:  08/25/2021 FINDINGS: The tracheostomy tube is stable. The cardiac silhouette, mediastinal and hilar contours are within normal limits and unchanged. Chronic emphysematous changes but no definite acute overlying pulmonary process. Remote healed rib fractures are again noted. IMPRESSION: Chronic emphysematous changes but no acute overlying pulmonary process. Electronically Signed   By: Rudie Meyer M.D.   On: 08/30/2021 12:03    Assessment/Plan Active Problems:   Acute on chronic respiratory failure with hypoxia (HCC)   Transverse myelitis (HCC)   Septic shock (HCC)   Acute stroke of basal ganglia (HCC)   Acute metabolic encephalopathy   Tracheostomy dependence (HCC)   Acute on chronic respiratory failure with hypoxia patient is DNR therefore cannot be placed on mechanical ventilation and will also not be able to be resuscitated in the event of cardiac arrest.  Primary care team is going to speak with family regarding goals of care and the fact that the patient has not shown any improvement since his acute stroke Severe sepsis resolved hemodynamics are stable at this time. Acute stroke no improvement has been noted  Encephalopathy overall no change we will continue to monitor along closely Tracheostomy will need to remain in place again goals of care meeting intended with family   I have personally seen and evaluated the patient, evaluated laboratory and imaging results, formulated the assessment and plan and placed orders. The Patient requires high complexity decision making with multiple systems  involvement.  Rounds were done with the Respiratory Therapy Director and Staff therapists and discussed with nursing staff also.  Yevonne Pax, MD [facility] Pulmonary Critical Care Medicine Sleep Medicine

## 2021-09-01 DIAGNOSIS — G9341 Metabolic encephalopathy: Secondary | ICD-10-CM

## 2021-09-01 DIAGNOSIS — G373 Acute transverse myelitis in demyelinating disease of central nervous system: Secondary | ICD-10-CM

## 2021-09-01 DIAGNOSIS — Z930 Tracheostomy status: Secondary | ICD-10-CM

## 2021-09-01 DIAGNOSIS — J9621 Acute and chronic respiratory failure with hypoxia: Secondary | ICD-10-CM

## 2021-09-01 DIAGNOSIS — A419 Sepsis, unspecified organism: Secondary | ICD-10-CM

## 2021-09-01 DIAGNOSIS — R6521 Severe sepsis with septic shock: Secondary | ICD-10-CM

## 2021-09-01 DIAGNOSIS — I639 Cerebral infarction, unspecified: Secondary | ICD-10-CM

## 2021-09-01 LAB — URINE CULTURE: Culture: >=100000 — AB

## 2021-09-01 NOTE — Progress Notes (Signed)
Pulmonary Critical Care Medicine [facility] [place]   PULMONARY CRITICAL CARE SERVICE  PROGRESS NOTE     Dominic Benjamin  WUJ:[id number]  DOB: 16-Oct-1966   DOA: 08/16/2021  Referring Physician: Luna Kitchens, MD  HPI: Dominic Benjamin is a 55 y.o. male being followed for ventilator/airway/oxygen weaning Acute on Chronic Respiratory Failure.  Patient is comfortable right now without distress low-grade fevers noted and patient's oxygen requirements have not improved.  Patient remains DNR not to be placed on mechanical ventilation.  Medications: Reviewed on Rounds  Physical Exam:  Vitals: Temperature is 99.3 pulse 104 respiratory is 30 blood pressure is 138/82 saturations 100%  Ventilator Settings patient currently is on 100% nonrebreather  General: Comfortable at this time Neck: supple Cardiovascular: no malignant arrhythmias Respiratory: Coarse breath sounds are noted bilaterally Skin: no rash seen on limited exam Musculoskeletal: No gross abnormality Psychiatric:unable to assess Neurologic:no involuntary movements         Lab Data:   Basic Metabolic Panel: Recent Labs  Lab 08/26/21 0352 08/28/21 0355  NA 136 138  K 4.8 4.3  CL 101 100  CO2 29 29  GLUCOSE 239* 219*  BUN 26* 19  CREATININE <0.30* <0.30*  CALCIUM 8.5* 8.6*    ABG: No results for input(s): PHART, PCO2ART, PO2ART, HCO3, O2SAT in the last 168 hours.  Liver Function Tests: No results for input(s): AST, ALT, ALKPHOS, BILITOT, PROT, ALBUMIN in the last 168 hours. No results for input(s): LIPASE, AMYLASE in the last 168 hours. No results for input(s): AMMONIA in the last 168 hours.  CBC: Recent Labs  Lab 08/26/21 0352 08/28/21 0355 08/30/21 0357 08/31/21 0337  WBC 16.0* 13.6* 19.5* 22.6*  NEUTROABS  --   --  17.0*  --   HGB 9.8* 10.4* 10.8* 11.9*  HCT 33.2* 34.0* 34.7* 38.3*  MCV 95.1 93.2 93.5 93.2  PLT 155 150 143* 143*    Cardiac Enzymes: No results for input(s):  CKTOTAL, CKMB, CKMBINDEX, TROPONINI in the last 168 hours.  BNP (last 3 results) No results for input(s): BNP in the last 8760 hours.  ProBNP (last 3 results) No results for input(s): PROBNP in the last 8760 hours.  Radiological Exams: DG Chest Port 1 View  Result Date: 08/30/2021 CLINICAL DATA:  Shortness of breath. EXAM: PORTABLE CHEST 1 VIEW COMPARISON:  08/25/2021 FINDINGS: The tracheostomy tube is stable. The cardiac silhouette, mediastinal and hilar contours are within normal limits and unchanged. Chronic emphysematous changes but no definite acute overlying pulmonary process. Remote healed rib fractures are again noted. IMPRESSION: Chronic emphysematous changes but no acute overlying pulmonary process. Electronically Signed   By: Rudie Meyer M.D.   On: 08/30/2021 12:03    Assessment/Plan Active Problems:   Acute on chronic respiratory failure with hypoxia (HCC)   Transverse myelitis (HCC)   Septic shock (HCC)   Acute stroke of basal ganglia (HCC)   Acute metabolic encephalopathy   Tracheostomy dependence (HCC)   Acute on chronic respiratory failure with hypoxia patient overall comfortable but doing poorly.  Again do not expect him to be surviving this admission the patient is DNR however family wanted to continue with the feeding Transverse myelitis no change we will continue to follow along closely. Sepsis with shock Acute stroke there is been no meaningful improvement in neurological status Tracheostomy right now remains in place   I have personally seen and evaluated the patient, evaluated laboratory and imaging results, formulated the assessment and plan and placed orders. The Patient requires  high complexity decision making with multiple systems involvement.  Rounds were done with the Respiratory Therapy Director and Staff therapists and discussed with nursing staff also.  Allyne Gee, MD [facility] Pulmonary Critical Care Medicine Sleep Medicine

## 2021-09-02 DIAGNOSIS — I639 Cerebral infarction, unspecified: Secondary | ICD-10-CM | POA: Diagnosis not present

## 2021-09-02 DIAGNOSIS — J9621 Acute and chronic respiratory failure with hypoxia: Secondary | ICD-10-CM | POA: Diagnosis not present

## 2021-09-02 DIAGNOSIS — G9341 Metabolic encephalopathy: Secondary | ICD-10-CM | POA: Diagnosis not present

## 2021-09-02 DIAGNOSIS — A419 Sepsis, unspecified organism: Secondary | ICD-10-CM | POA: Diagnosis not present

## 2021-09-02 NOTE — Progress Notes (Signed)
Pulmonary Critical Care Medicine [facility] [place]   PULMONARY CRITICAL CARE SERVICE  PROGRESS NOTE     Dominic Benjamin  KYH:[id number]  DOB: 01/20/1966   DOA: 07/23/2021  Referring Physician: Luna Kitchens, MD  HPI: Dominic Benjamin is a 55 y.o. male being followed for ventilator/airway/oxygen weaning Acute on Chronic Respiratory Failure.  Patient is afebrile right now resting comfortably without distress was on 100% nonrebreather.  He is DNR however family does not want to start feeding they do not want to escalate care either  Medications: Reviewed on Rounds  Physical Exam:  Vitals: Temperature is 98.0 pulse 117 respiratory rate is 40 blood pressure is 117/65 saturations 95%  Ventilator Settings on T collar with 100% nonrebreather also  General: Comfortable at this time Neck: supple Cardiovascular: no malignant arrhythmias Respiratory: Scattered rhonchi and coarse breath sounds Skin: no rash seen on limited exam Musculoskeletal: No gross abnormality Psychiatric:unable to assess Neurologic:no involuntary movements         Lab Data:   Basic Metabolic Panel: Recent Labs  Lab 08/28/21 0355  NA 138  K 4.3  CL 100  CO2 29  GLUCOSE 219*  BUN 19  CREATININE <0.30*  CALCIUM 8.6*    ABG: No results for input(s): PHART, PCO2ART, PO2ART, HCO3, O2SAT in the last 168 hours.  Liver Function Tests: No results for input(s): AST, ALT, ALKPHOS, BILITOT, PROT, ALBUMIN in the last 168 hours. No results for input(s): LIPASE, AMYLASE in the last 168 hours. No results for input(s): AMMONIA in the last 168 hours.  CBC: Recent Labs  Lab 08/28/21 0355 08/30/21 0357 08/31/21 0337  WBC 13.6* 19.5* 22.6*  NEUTROABS  --  17.0*  --   HGB 10.4* 10.8* 11.9*  HCT 34.0* 34.7* 38.3*  MCV 93.2 93.5 93.2  PLT 150 143* 143*    Cardiac Enzymes: No results for input(s): CKTOTAL, CKMB, CKMBINDEX, TROPONINI in the last 168 hours.  BNP (last 3 results) No  results for input(s): BNP in the last 8760 hours.  ProBNP (last 3 results) No results for input(s): PROBNP in the last 8760 hours.  Radiological Exams: No results found.  Assessment/Plan Active Problems:   Acute on chronic respiratory failure with hypoxia (HCC)   Transverse myelitis (HCC)   Septic shock (HCC)   Acute stroke of basal ganglia (HCC)   Acute metabolic encephalopathy   Tracheostomy dependence (HCC)   Acute on chronic respiratory failure with hypoxia remains on the 100% oxygen prognosis is quite poor not able to do any weaning we will try to keep the patient as comfortable as possible Sepsis shock hemodynamic stable Acute stroke no improvement Metabolic encephalopathy patient is unchanged Transverse myelitis at stable this time Tracheostomy remains in place   I have personally seen and evaluated the patient, evaluated laboratory and imaging results, formulated the assessment and plan and placed orders. The Patient requires high complexity decision making with multiple systems involvement.  Rounds were done with the Respiratory Therapy Director and Staff therapists and discussed with nursing staff also.  Yevonne Pax, MD [facility] Pulmonary Critical Care Medicine Sleep Medicine

## 2021-09-03 DIAGNOSIS — G9341 Metabolic encephalopathy: Secondary | ICD-10-CM | POA: Diagnosis not present

## 2021-09-03 DIAGNOSIS — A419 Sepsis, unspecified organism: Secondary | ICD-10-CM | POA: Diagnosis not present

## 2021-09-03 DIAGNOSIS — I639 Cerebral infarction, unspecified: Secondary | ICD-10-CM | POA: Diagnosis not present

## 2021-09-03 DIAGNOSIS — J9621 Acute and chronic respiratory failure with hypoxia: Secondary | ICD-10-CM | POA: Diagnosis not present

## 2021-09-03 NOTE — Progress Notes (Signed)
Pulmonary Critical Care Medicine [facility] [place]   PULMONARY CRITICAL CARE SERVICE  PROGRESS NOTE     Nehan Flaum  LEX:[id number]  DOB: Jul 15, 1966   DOA: 08/09/2021  Referring Physician: Luna Kitchens, MD  HPI: Ashraf Mesta is a 55 y.o. male being followed for ventilator/airway/oxygen weaning Acute on Chronic Respiratory Failure.  No fevers noted patient is now on 60% FiO2  Medications: Reviewed on Rounds  Physical Exam:  Vitals: Temperature is 98 pulse 117 respiratory rate was 14 blood pressure is 110/65 saturations 100%  Ventilator Settings on T collar with an FiO2 of 60%  General: Comfortable at this time Neck: supple Cardiovascular: no malignant arrhythmias Respiratory: Coarse rhonchi expansion is equal Skin: no rash seen on limited exam Musculoskeletal: No gross abnormality Psychiatric:unable to assess Neurologic:no involuntary movements         Lab Data:   Basic Metabolic Panel: Recent Labs  Lab 08/28/21 0355  NA 138  K 4.3  CL 100  CO2 29  GLUCOSE 219*  BUN 19  CREATININE <0.30*  CALCIUM 8.6*    ABG: No results for input(s): PHART, PCO2ART, PO2ART, HCO3, O2SAT in the last 168 hours.  Liver Function Tests: No results for input(s): AST, ALT, ALKPHOS, BILITOT, PROT, ALBUMIN in the last 168 hours. No results for input(s): LIPASE, AMYLASE in the last 168 hours. No results for input(s): AMMONIA in the last 168 hours.  CBC: Recent Labs  Lab 08/28/21 0355 08/30/21 0357 08/31/21 0337  WBC 13.6* 19.5* 22.6*  NEUTROABS  --  17.0*  --   HGB 10.4* 10.8* 11.9*  HCT 34.0* 34.7* 38.3*  MCV 93.2 93.5 93.2  PLT 150 143* 143*    Cardiac Enzymes: No results for input(s): CKTOTAL, CKMB, CKMBINDEX, TROPONINI in the last 168 hours.  BNP (last 3 results) No results for input(s): BNP in the last 8760 hours.  ProBNP (last 3 results) No results for input(s): PROBNP in the last 8760 hours.  Radiological Exams: No results  found.  Assessment/Plan Active Problems:   Acute on chronic respiratory failure with hypoxia (HCC)   Transverse myelitis (HCC)   Septic shock (HCC)   Acute stroke of basal ganglia (HCC)   Acute metabolic encephalopathy   Tracheostomy dependence (HCC)   Acute on chronic respiratory failure hypoxia plan is going to be to continue with T collar weaning patient right now is on 60% FiO2 continue secretion management pulmonary toilet.  Patient remains DNR Transverse myelitis no change we will continue with supportive care Acute stroke has had no improvement since the stroke Metabolic encephalopathy no improvement neurologically overall Tracheostomy will remain in place   I have personally seen and evaluated the patient, evaluated laboratory and imaging results, formulated the assessment and plan and placed orders. The Patient requires high complexity decision making with multiple systems involvement.  Rounds were done with the Respiratory Therapy Director and Staff therapists and discussed with nursing staff also.  Yevonne Pax, MD [facility] Pulmonary Critical Care Medicine Sleep Medicine

## 2021-09-04 ENCOUNTER — Other Ambulatory Visit (HOSPITAL_COMMUNITY): Payer: Self-pay

## 2021-09-20 DEATH — deceased

## 2021-10-20 DEATH — deceased

## 2023-02-17 IMAGING — DX DG CHEST 1V PORT
1 series · 1 of 1 positions shown · non-contrast
Comparison: 07/01/2021

CLINICAL DATA: Elevated white blood cell count

EXAM:
PORTABLE CHEST 1 VIEW

[chest]
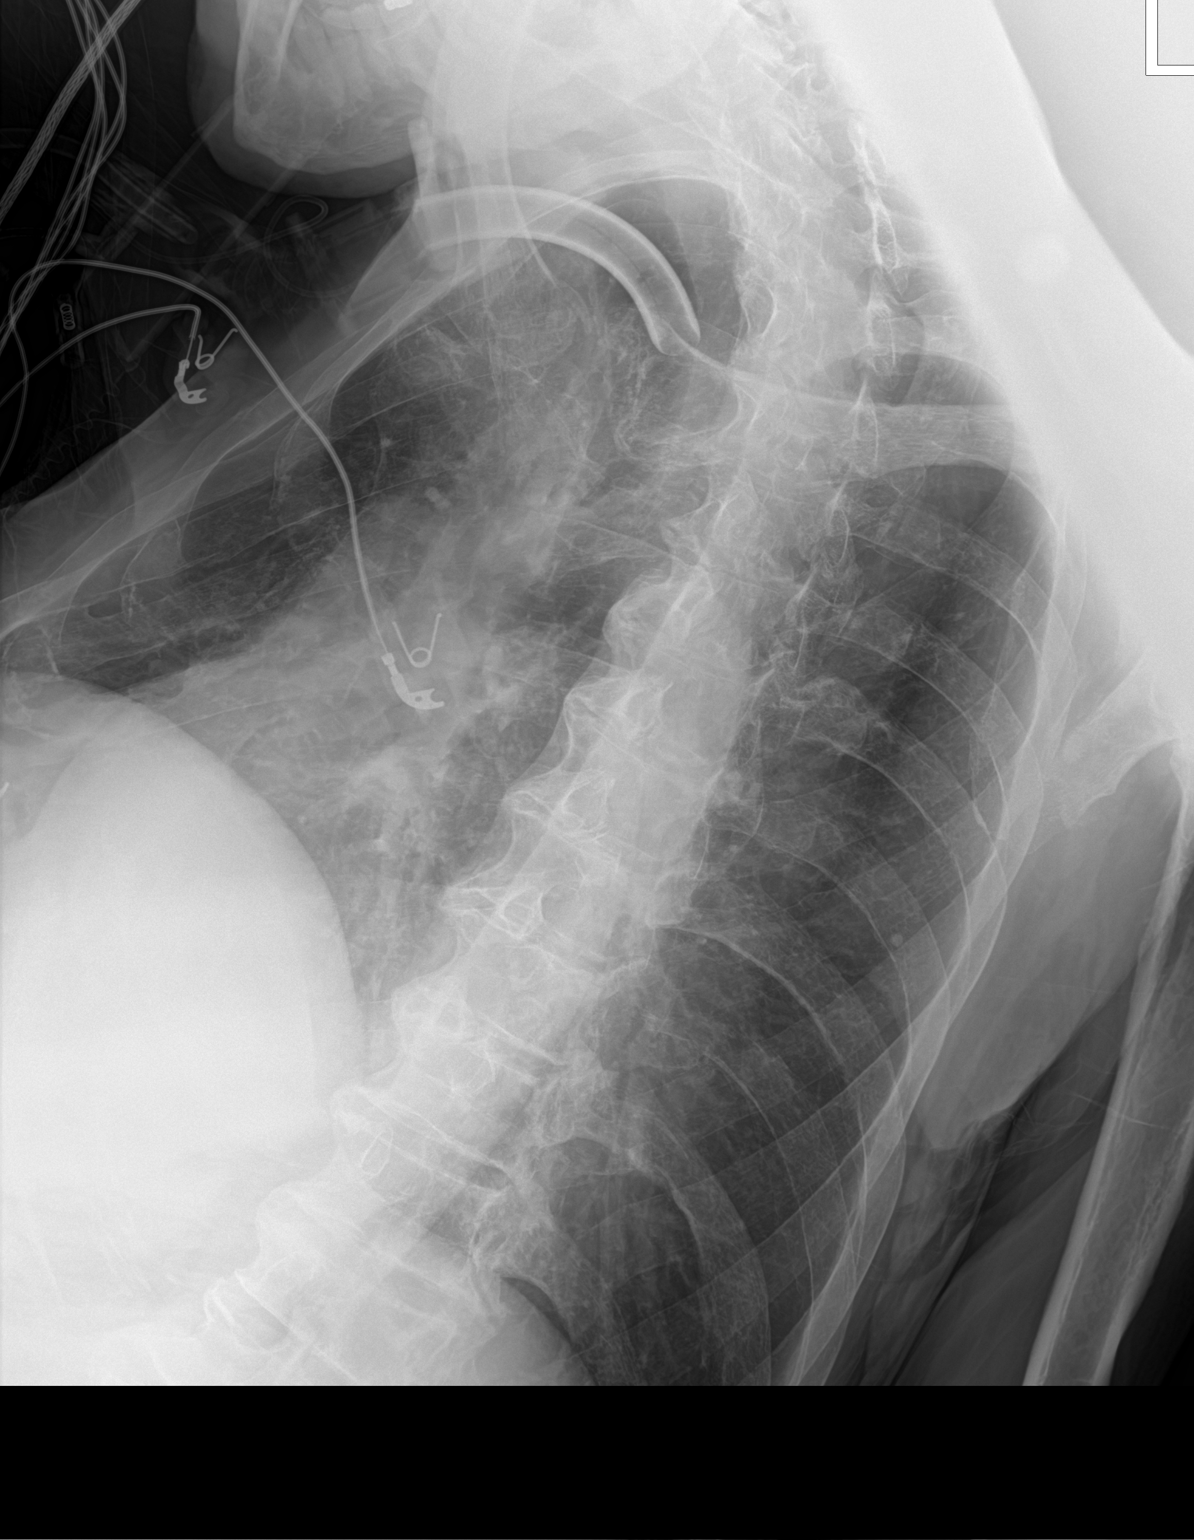

[1 of 1 positions shown; findings below may reference images not displayed]

FINDINGS: Frontal view of the chest was obtained. The patient is markedly
rotated to the left at the time of imaging, severely limited the
evaluation. Please note that the image is mislabeled by the
technologist, as previous exams have demonstrated an elevated left
hemidiaphragm.

The cardiac silhouette is grossly unremarkable. No airspace disease,
effusion, or pneumothorax. No acute bony abnormality. Tracheostomy
tube unchanged.
IMPRESSION: 1. Severely limited evaluation due to patient positioning.
2. No gross airspace disease.
